# Patient Record
Sex: Male | Born: 1965 | Race: White | Hispanic: No | Smoking: Never smoker
Health system: Southern US, Community
[De-identification: ages and names within clinical notes are randomized; demographics above are authoritative.]

## PROBLEM LIST (undated history)

## (undated) DIAGNOSIS — E785 Hyperlipidemia, unspecified: Secondary | ICD-10-CM

## (undated) DIAGNOSIS — E119 Type 2 diabetes mellitus without complications: Secondary | ICD-10-CM

## (undated) DIAGNOSIS — Z8489 Family history of other specified conditions: Secondary | ICD-10-CM

## (undated) DIAGNOSIS — I10 Essential (primary) hypertension: Secondary | ICD-10-CM

## (undated) DIAGNOSIS — K5792 Diverticulitis of intestine, part unspecified, without perforation or abscess without bleeding: Secondary | ICD-10-CM

## (undated) HISTORY — PX: OTHER SURGICAL HISTORY: SHX169

## (undated) HISTORY — DX: Hyperlipidemia, unspecified: E78.5

## (undated) HISTORY — PX: JOINT REPLACEMENT: SHX530

---

## 2004-12-14 ENCOUNTER — Ambulatory Visit: Payer: Self-pay | Admitting: Specialist

## 2010-07-02 ENCOUNTER — Ambulatory Visit: Payer: Self-pay | Admitting: Internal Medicine

## 2012-01-29 HISTORY — PX: COLONOSCOPY: SHX174

## 2013-05-02 ENCOUNTER — Inpatient Hospital Stay (HOSPITAL_COMMUNITY)
Admission: EM | Admit: 2013-05-02 | Discharge: 2013-05-07 | DRG: 392 | Disposition: A | Discharge status: Home / Self Care | Payer: 59 | Source: Other Acute Inpatient Hospital | Attending: General Surgery | Admitting: General Surgery

## 2013-05-02 ENCOUNTER — Emergency Department: Payer: Self-pay | Admitting: Emergency Medicine

## 2013-05-02 DIAGNOSIS — K5732 Diverticulitis of large intestine without perforation or abscess without bleeding: Principal | ICD-10-CM | POA: Diagnosis present

## 2013-05-02 HISTORY — DX: Type 2 diabetes mellitus without complications: E11.9

## 2013-05-02 LAB — CBC WITH DIFFERENTIAL/PLATELET
BASOS PCT: 0.4 %
Basophil #: 0 10*3/uL (ref 0.0–0.1)
Eosinophil #: 0.1 10*3/uL (ref 0.0–0.7)
Eosinophil %: 0.5 %
HCT: 46 % (ref 40.0–52.0)
HGB: 15.9 g/dL (ref 13.0–18.0)
LYMPHS ABS: 1.5 10*3/uL (ref 1.0–3.6)
Lymphocyte %: 13.8 %
MCH: 30 pg (ref 26.0–34.0)
MCHC: 34.5 g/dL (ref 32.0–36.0)
MCV: 87 fL (ref 80–100)
MONOS PCT: 6.5 %
Monocyte #: 0.7 x10 3/mm (ref 0.2–1.0)
Neutrophil #: 8.8 10*3/uL — ABNORMAL HIGH (ref 1.4–6.5)
Neutrophil %: 78.8 %
PLATELETS: 177 10*3/uL (ref 150–440)
RBC: 5.3 10*6/uL (ref 4.40–5.90)
RDW: 12.9 % (ref 11.5–14.5)
WBC: 11.2 10*3/uL — AB (ref 3.8–10.6)

## 2013-05-02 LAB — URINALYSIS, COMPLETE
Bacteria: NONE SEEN
Bilirubin,UR: NEGATIVE
Blood: NEGATIVE
Glucose,UR: NEGATIVE mg/dL (ref 0–75)
KETONE: NEGATIVE
Leukocyte Esterase: NEGATIVE
NITRITE: NEGATIVE
Ph: 6 (ref 4.5–8.0)
Protein: NEGATIVE
RBC,UR: NONE SEEN /HPF (ref 0–5)
SPECIFIC GRAVITY: 1.004 (ref 1.003–1.030)
Squamous Epithelial: NONE SEEN
WBC UR: <1 /HPF (ref 0–5)

## 2013-05-02 LAB — COMPREHENSIVE METABOLIC PANEL
ALBUMIN: 3.8 g/dL (ref 3.4–5.0)
Alkaline Phosphatase: 49 U/L
Anion Gap: 7 (ref 7–16)
BUN: 11 mg/dL (ref 7–18)
Bilirubin,Total: 0.6 mg/dL (ref 0.2–1.0)
CREATININE: 1.01 mg/dL (ref 0.60–1.30)
Calcium, Total: 8.7 mg/dL (ref 8.5–10.1)
Chloride: 99 mmol/L (ref 98–107)
Co2: 28 mmol/L (ref 21–32)
EGFR (African American): >60
EGFR (Non-African Amer.): >60
Glucose: 207 mg/dL — ABNORMAL HIGH (ref 65–99)
Osmolality: 274 (ref 275–301)
POTASSIUM: 4 mmol/L (ref 3.5–5.1)
SGOT(AST): 31 U/L (ref 15–37)
SGPT (ALT): 72 U/L (ref 12–78)
SODIUM: 134 mmol/L — AB (ref 136–145)
Total Protein: 6.9 g/dL (ref 6.4–8.2)

## 2013-05-02 LAB — LIPASE, BLOOD: LIPASE: 171 U/L (ref 73–393)

## 2013-05-02 LAB — TROPONIN I: Troponin-I: <0.02 ng/mL

## 2013-05-03 ENCOUNTER — Encounter (HOSPITAL_COMMUNITY): Payer: Self-pay | Admitting: *Deleted

## 2013-05-03 DIAGNOSIS — K5732 Diverticulitis of large intestine without perforation or abscess without bleeding: Secondary | ICD-10-CM | POA: Diagnosis present

## 2013-05-03 LAB — CBC
HEMATOCRIT: 43.4 % (ref 39.0–52.0)
HEMOGLOBIN: 15.7 g/dL (ref 13.0–17.0)
MCH: 31.2 pg (ref 26.0–34.0)
MCHC: 36.2 g/dL — ABNORMAL HIGH (ref 30.0–36.0)
MCV: 86.1 fL (ref 78.0–100.0)
Platelets: 174 10*3/uL (ref 150–400)
RBC: 5.04 MIL/uL (ref 4.22–5.81)
RDW: 12.4 % (ref 11.5–15.5)
WBC: 9.2 10*3/uL (ref 4.0–10.5)

## 2013-05-03 LAB — GLUCOSE, CAPILLARY
GLUCOSE-CAPILLARY: 159 mg/dL — AB (ref 70–99)
GLUCOSE-CAPILLARY: 144 mg/dL — AB (ref 70–99)
GLUCOSE-CAPILLARY: 126 mg/dL — AB (ref 70–99)
Glucose-Capillary: 148 mg/dL — ABNORMAL HIGH (ref 70–99)
Glucose-Capillary: 148 mg/dL — ABNORMAL HIGH (ref 70–99)

## 2013-05-03 LAB — BASIC METABOLIC PANEL
BUN: 9 mg/dL (ref 6–23)
CHLORIDE: 100 meq/L (ref 96–112)
CO2: 25 mEq/L (ref 19–32)
Calcium: 8.8 mg/dL (ref 8.4–10.5)
Creatinine, Ser: 0.93 mg/dL (ref 0.50–1.35)
GFR calc Af Amer: >90 mL/min (ref >90)
GFR calc non Af Amer: >90 mL/min (ref >90)
Glucose, Bld: 192 mg/dL — ABNORMAL HIGH (ref 70–99)
POTASSIUM: 3.9 meq/L (ref 3.7–5.3)
Sodium: 140 mEq/L (ref 137–147)

## 2013-05-03 MED ORDER — KCL IN DEXTROSE-NACL 20-5-0.45 MEQ/L-%-% IV SOLN
INTRAVENOUS | Status: DC
  Administered 2013-05-03 – 2013-05-05 (×6): via INTRAVENOUS
  Filled 2013-05-03 (×10): qty 1000

## 2013-05-03 MED ORDER — ONDANSETRON HCL 4 MG/2ML IJ SOLN
4.0000 mg | Freq: Four times a day (QID) | INTRAMUSCULAR | Status: DC | PRN
  Administered 2013-05-04 – 2013-05-05 (×3): 4 mg via INTRAVENOUS
  Filled 2013-05-03 (×3): qty 2

## 2013-05-03 MED ORDER — CHLORHEXIDINE GLUCONATE 0.12 % MT SOLN
15.0000 mL | Freq: Two times a day (BID) | OROMUCOSAL | Status: DC
  Administered 2013-05-03 – 2013-05-06 (×5): 15 mL via OROMUCOSAL
  Filled 2013-05-03 (×6): qty 15

## 2013-05-03 MED ORDER — SODIUM CHLORIDE 0.9 % IV SOLN
1.0000 g | INTRAVENOUS | Status: DC
  Administered 2013-05-03 – 2013-05-06 (×4): 1 g via INTRAVENOUS
  Filled 2013-05-03 (×4): qty 1

## 2013-05-03 MED ORDER — HYDROMORPHONE HCL PF 1 MG/ML IJ SOLN
1.0000 mg | INTRAMUSCULAR | Status: DC | PRN
  Administered 2013-05-03 – 2013-05-04 (×9): 1 mg via INTRAVENOUS
  Filled 2013-05-03 (×12): qty 1

## 2013-05-03 MED ORDER — DIPHENHYDRAMINE HCL 50 MG/ML IJ SOLN
12.5000 mg | Freq: Three times a day (TID) | INTRAMUSCULAR | Status: DC | PRN
  Administered 2013-05-03 (×2): 25 mg via INTRAVENOUS
  Filled 2013-05-03 (×2): qty 1

## 2013-05-03 MED ORDER — INSULIN ASPART 100 UNIT/ML ~~LOC~~ SOLN
0.0000 [IU] | Freq: Every day | SUBCUTANEOUS | Status: DC
Start: 2013-05-03 — End: 2013-05-07

## 2013-05-03 MED ORDER — INSULIN ASPART 100 UNIT/ML ~~LOC~~ SOLN
0.0000 [IU] | Freq: Three times a day (TID) | SUBCUTANEOUS | Status: DC
  Administered 2013-05-03 (×2): 1 [IU] via SUBCUTANEOUS
  Administered 2013-05-03 – 2013-05-04 (×2): 2 [IU] via SUBCUTANEOUS
  Administered 2013-05-04: 1 [IU] via SUBCUTANEOUS
  Administered 2013-05-05: 2 [IU] via SUBCUTANEOUS
  Administered 2013-05-05 – 2013-05-06 (×3): 1 [IU] via SUBCUTANEOUS
  Administered 2013-05-06: 2 [IU] via SUBCUTANEOUS
  Administered 2013-05-07: 1 [IU] via SUBCUTANEOUS

## 2013-05-03 MED ORDER — ENOXAPARIN SODIUM 40 MG/0.4ML ~~LOC~~ SOLN
40.0000 mg | SUBCUTANEOUS | Status: DC
  Administered 2013-05-03 – 2013-05-06 (×4): 40 mg via SUBCUTANEOUS
  Filled 2013-05-03 (×5): qty 0.4

## 2013-05-03 MED ORDER — PANTOPRAZOLE SODIUM 40 MG IV SOLR
40.0000 mg | Freq: Every day | INTRAVENOUS | Status: DC
  Administered 2013-05-03 – 2013-05-06 (×5): 40 mg via INTRAVENOUS
  Filled 2013-05-03 (×9): qty 40

## 2013-05-03 MED ORDER — BIOTENE DRY MOUTH MT LIQD
15.0000 mL | Freq: Two times a day (BID) | OROMUCOSAL | Status: DC
  Administered 2013-05-03 – 2013-05-06 (×4): 15 mL via OROMUCOSAL

## 2013-05-03 NOTE — H&P (Signed)
Francisco Porter is an 48 y.o. male.   Chief Complaint: Left lower quadrant abdominal pain HPI: He presented to [facility] emergency department earlier tonight with left lower quadrant abdominal pain. He has a previous history of diverticulitis. This pain was similar but more severe. Workup demonstrated mild leukocytosis 11,200 and CT scan of the abdomen and pelvis was obtained. This demonstrated sigmoid diverticulitis with microperforation. No abscess was seen. The patient requested transfer to [facility] hospital and I accepted.  Past medical history: Diverticulitis, colon polyps No past surgical history on file.  No family history on file. Social History: Does not smoke, occasionally drinks alcohol  Allergies: Codeine  No prescriptions prior to admission    No results found for this or any previous visit (from the past 48 hour(s)). No results found.  Review of Systems  Constitutional: Negative for fever and chills.  HENT: Negative.   Eyes: Negative.   Respiratory: Negative.   Cardiovascular: Negative for chest pain and leg swelling.  Gastrointestinal: Positive for abdominal pain. Negative for nausea, vomiting and diarrhea.  Genitourinary: Negative.   Musculoskeletal: Negative.   Skin: Negative.   Neurological: Negative.   Endo/Heme/Allergies: Negative.   Psychiatric/Behavioral: Negative.     Blood pressure 135/66, pulse 92, temperature 98.3 F (36.8 C), temperature source Oral, resp. rate 20, height 5\' 8"  (1.727 m), weight 199 lb 15.3 oz (90.7 kg), SpO2 100.00%. Physical Exam  Constitutional: He is oriented to person, place, and time. He appears well-developed and well-nourished. No distress.  HENT:  Head: Normocephalic and atraumatic.  Mouth/Throat: Oropharynx is clear and moist. No oropharyngeal exudate.  Eyes: EOM are normal. Pupils are equal, round, and reactive to light. Right eye exhibits no discharge. Left eye exhibits no discharge.  Neck: Normal range of motion.  Neck supple. No tracheal deviation present.  Cardiovascular: Normal rate and intact distal pulses.   Murmur heard. 2/6 systolic murmur  Respiratory: Effort normal and breath sounds normal. No stridor. No respiratory distress. He has no wheezes. He has no rales.  GI: Soft. He exhibits no distension. There is tenderness. There is guarding. There is no rebound.  Tender left lower quadrant without peritoneal signs, occasional voluntary guarding  Musculoskeletal: Normal range of motion. He exhibits no edema and no tenderness.  Neurological: He is alert and oriented to person, place, and time. He exhibits normal muscle tone. Coordination normal.  Skin: Skin is warm.  Psychiatric: He has a normal mood and affect.     Assessment/Plan Acute sigmoid diverticulitis with microperforation - plan IV antibiotics and bowel rest. If he does not improve, may need urgent colectomy with likely colostomy. This plan was discussed in detail with him and I answered his questions.  Primus Gritton E 05/03/2013, 12:05 AM

## 2013-05-03 NOTE — Progress Notes (Signed)
UR completed.  Eddye Broxterman, RN BSN M[facility] CCM Trauma/Neuro ICU Case Manager [phone]  

## 2013-05-03 NOTE — Progress Notes (Signed)
  Subjective: Pt doing well today.  Less abd pain.  No BM/diarrhea.  Passing flatus  Objective: Vital signs in last 24 hours: Temp:  [98.3 F (36.8 C)-98.6 F (37 C)] 98.3 F (36.8 C) (04/06 0618) Pulse Rate:  [62-103] 103 (04/06 0618) Resp:  [17-20] 17 (04/06 0618) BP: (106-135)/(55-69) 106/69 mmHg (04/06 0618) SpO2:  [98 %-100 %] 99 % (04/06 0618) Weight:  [199 lb 15.3 oz (90.7 kg)] 199 lb 15.3 oz (90.7 kg) (04/05 2320)    Intake/Output from previous day: 04/05 0701 - 04/06 0700 In: 614.6 [I.V.:614.6] Out: 400 [Urine:400] Intake/Output this shift:    General appearance: alert and cooperative Resp: clear to auscultation bilaterally Cardio: RRR GI: soft, TTP LLQ, min TTP RLQ, no rebound/guarding active BS  Lab Results:   Recent Labs  05/03/13 0608  WBC 9.2  HGB 15.7  HCT 43.4  PLT 174   BMET  Recent Labs  05/03/13 0608  NA 140  K 3.9  CL 100  CO2 25  GLUCOSE 192*  BUN 9  CREATININE 0.93  CALCIUM 8.8   PT/INR No results found for this basename: LABPROT, INR,  in the last 72 hours ABG No results found for this basename: PHART, PCO2, PO2, HCO3,  in the last 72 hours  Studies/Results: No results found.  Anti-infectives: Anti-infectives   Start     Dose/Rate Route Frequency Ordered Stop   05/03/13 0030  ertapenem (INVANZ) 1 g in sodium chloride 0.9 % 50 mL IVPB     1 g 100 mL/hr over 30 Minutes Intravenous Every 24 hours 05/03/13 0021        Assessment/Plan: 48 y/o M with diverticulitis and microperforation.  Abd pain appears to be improving with abx at this time. 1. Con't abx 2. Con't NPO 3. Encourage Ambulation   Rosario Jacks., Anne Hahn 05/03/2013

## 2013-05-04 DIAGNOSIS — R51 Headache: Secondary | ICD-10-CM

## 2013-05-04 LAB — BASIC METABOLIC PANEL
BUN: 7 mg/dL (ref 6–23)
CO2: 27 meq/L (ref 19–32)
Calcium: 8.5 mg/dL (ref 8.4–10.5)
Chloride: 99 mEq/L (ref 96–112)
Creatinine, Ser: 0.99 mg/dL (ref 0.50–1.35)
GFR calc Af Amer: >90 mL/min (ref >90)
GFR calc non Af Amer: >90 mL/min (ref >90)
GLUCOSE: 171 mg/dL — AB (ref 70–99)
POTASSIUM: 4.3 meq/L (ref 3.7–5.3)
SODIUM: 138 meq/L (ref 137–147)

## 2013-05-04 LAB — CBC
HCT: 43.7 % (ref 39.0–52.0)
Hemoglobin: 15.6 g/dL (ref 13.0–17.0)
MCH: 31.4 pg (ref 26.0–34.0)
MCHC: 35.7 g/dL (ref 30.0–36.0)
MCV: 87.9 fL (ref 78.0–100.0)
PLATELETS: 157 10*3/uL (ref 150–400)
RBC: 4.97 MIL/uL (ref 4.22–5.81)
RDW: 12.3 % (ref 11.5–15.5)
WBC: 8.6 10*3/uL (ref 4.0–10.5)

## 2013-05-04 LAB — GLUCOSE, CAPILLARY
GLUCOSE-CAPILLARY: 174 mg/dL — AB (ref 70–99)
GLUCOSE-CAPILLARY: 147 mg/dL — AB (ref 70–99)
GLUCOSE-CAPILLARY: 146 mg/dL — AB (ref 70–99)
Glucose-Capillary: 116 mg/dL — ABNORMAL HIGH (ref 70–99)

## 2013-05-04 MED ORDER — ACETAMINOPHEN 500 MG PO TABS
1000.0000 mg | ORAL_TABLET | Freq: Four times a day (QID) | ORAL | Status: DC | PRN
  Administered 2013-05-04 – 2013-05-05 (×2): 1000 mg via ORAL
  Filled 2013-05-04 (×2): qty 2

## 2013-05-04 MED ORDER — LORAZEPAM 2 MG/ML IJ SOLN
0.5000 mg | Freq: Four times a day (QID) | INTRAMUSCULAR | Status: DC | PRN
  Administered 2013-05-05: 0.5 mg via INTRAVENOUS
  Filled 2013-05-04 (×2): qty 1

## 2013-05-04 MED ORDER — MORPHINE SULFATE 2 MG/ML IJ SOLN
1.0000 mg | INTRAMUSCULAR | Status: DC | PRN
  Administered 2013-05-04 – 2013-05-05 (×6): 2 mg via INTRAVENOUS
  Filled 2013-05-04 (×6): qty 1

## 2013-05-04 MED ORDER — IBUPROFEN 400 MG PO TABS
400.0000 mg | ORAL_TABLET | Freq: Three times a day (TID) | ORAL | Status: DC | PRN
  Administered 2013-05-04 – 2013-05-06 (×3): 600 mg via ORAL
  Filled 2013-05-04 (×3): qty 2

## 2013-05-04 NOTE — Progress Notes (Signed)
Patient ID: Francisco Porter, male   DOB: [birth year], 48 y.o.   MRN: [id number]    Subjective: Pt feels better than yesterday, but still having pain.  Passing flatus and stool.  Having a horrible headache.  Objective: Vital signs in last 24 hours: Temp:  [97.5 F (36.4 C)-99.2 F (37.3 C)] 99.2 F (37.3 C) (04/07 0542) Pulse Rate:  [95-100] 100 (04/07 0542) Resp:  [16-17] 16 (04/07 0542) BP: (118-125)/(79-84) 125/79 mmHg (04/07 0542) SpO2:  [96 %] 96 % (04/07 0542) Last BM Date: 05/04/13  Intake/Output from previous day: 04/06 0701 - 04/07 0700 In: 2675 [I.V.:2625; IV Piggyback:50] Out: 3299 [Urine:1650] Intake/Output this shift: Total I/O In: 0  Out: 325 [Urine:325]  PE: Abd: soft, still very tender in LLQ, mild RLQ tenderness, +BS, ND Heart: regular Lungs: CTAB  Lab Results:   Recent Labs  05/03/13 0608 05/04/13 0611  WBC 9.2 8.6  HGB 15.7 15.6  HCT 43.4 43.7  PLT 174 157   BMET  Recent Labs  05/03/13 0608 05/04/13 0611  NA 140 138  K 3.9 4.3  CL 100 99  CO2 25 27  GLUCOSE 192* 171*  BUN 9 7  CREATININE 0.93 0.99  CALCIUM 8.8 8.5   PT/INR No results found for this basename: LABPROT, INR,  in the last 72 hours CMP     Component Value Date/Time   NA 138 05/04/2013 0611   K 4.3 05/04/2013 0611   CL 99 05/04/2013 0611   CO2 27 05/04/2013 0611   GLUCOSE 171* 05/04/2013 0611   BUN 7 05/04/2013 0611   CREATININE 0.99 05/04/2013 0611   CALCIUM 8.5 05/04/2013 0611   GFRNONAA >90 05/04/2013 0611   GFRAA >90 05/04/2013 0611   Lipase  No results found for this basename: lipase       Studies/Results: No results found.  Anti-infectives: Anti-infectives   Start     Dose/Rate Route Frequency Ordered Stop   05/03/13 0030  ertapenem (INVANZ) 1 g in sodium chloride 0.9 % 50 mL IVPB     1 g 100 mL/hr over 30 Minutes Intravenous Every 24 hours 05/03/13 0021         Assessment/Plan  1. Diverticulitis with microperforation  Plan: 1. Will add tylenol, ibuprofen  for HA.  Will also change dilaudid to morphine to see if this helps 2. Patient is still pretty tender.  Leave NPO x ice chips 3. Cont Invanz D2    LOS: 2 days    Bellina Tokarczyk E 05/04/2013, 11:11 AM Pager: [phone]

## 2013-05-04 NOTE — Progress Notes (Signed)
LLQ pain is not that bad when the patient is distracted.  Has had several bowel movements and has excellent bowel sounds.  Probably can start clear liquids tomorrow.  Complaining of headaches and nausea.  Kathryne Eriksson. Dahlia Bailiff, MD, [place] [phone] [facility]

## 2013-05-05 LAB — CBC
HCT: 42.9 % (ref 39.0–52.0)
Hemoglobin: 14.7 g/dL (ref 13.0–17.0)
MCH: 31.7 pg (ref 26.0–34.0)
MCHC: 34.3 g/dL (ref 30.0–36.0)
MCV: 92.5 fL (ref 78.0–100.0)
Platelets: 130 10*3/uL — ABNORMAL LOW (ref 150–400)
RBC: 4.64 MIL/uL (ref 4.22–5.81)
RDW: 12.6 % (ref 11.5–15.5)
WBC: 4.6 10*3/uL (ref 4.0–10.5)

## 2013-05-05 LAB — GLUCOSE, CAPILLARY
Glucose-Capillary: 133 mg/dL — ABNORMAL HIGH (ref 70–99)
Glucose-Capillary: 163 mg/dL — ABNORMAL HIGH (ref 70–99)
Glucose-Capillary: 142 mg/dL — ABNORMAL HIGH (ref 70–99)
Glucose-Capillary: 147 mg/dL — ABNORMAL HIGH (ref 70–99)

## 2013-05-05 NOTE — Progress Notes (Signed)
Doing much better.  Probably can advance to fulls tonight, soft in AM, possibly home tomorrow if he continues to do well.  Kathryne Eriksson. Dahlia Bailiff, MD, [place] [phone] [facility] Surgery

## 2013-05-05 NOTE — Progress Notes (Signed)
Patient ID: Francisco Porter, male   DOB: May 24, 1965, 48 y.o.   MRN: [id number]    Subjective: Pt feels much better today.  Less pain.  Objective: Vital signs in last 24 hours: Temp:  [97.5 F (36.4 C)-97.9 F (36.6 C)] 97.5 F (36.4 C) (04/08 0545) Pulse Rate:  [78-81] 79 (04/08 0545) Resp:  [16-17] 17 (04/08 0545) BP: (105-122)/(75-77) 122/75 mmHg (04/08 0545) SpO2:  [97 %-99 %] 97 % (04/08 0545) Last BM Date: 05/04/13  Intake/Output from previous day: 04/07 0701 - 04/08 0700 In: 810 [I.V.:810] Out: 825 [Urine:825] Intake/Output this shift: Total I/O In: 1113 [I.V.:1113] Out: -   PE: Abd: soft, much less tender in LLQ, NT elsewhere, +BS, ND  Lab Results:   Recent Labs  05/04/13 0611 05/05/13 0625  WBC 8.6 4.6  HGB 15.6 14.7  HCT 43.7 42.9  PLT 157 130*   BMET  Recent Labs  05/03/13 0608 05/04/13 0611  NA 140 138  K 3.9 4.3  CL 100 99  CO2 25 27  GLUCOSE 192* 171*  BUN 9 7  CREATININE 0.93 0.99  CALCIUM 8.8 8.5   PT/INR No results found for this basename: LABPROT, INR,  in the last 72 hours CMP     Component Value Date/Time   NA 138 05/04/2013 0611   K 4.3 05/04/2013 0611   CL 99 05/04/2013 0611   CO2 27 05/04/2013 0611   GLUCOSE 171* 05/04/2013 0611   BUN 7 05/04/2013 0611   CREATININE 0.99 05/04/2013 0611   CALCIUM 8.5 05/04/2013 0611   GFRNONAA >90 05/04/2013 0611   GFRAA >90 05/04/2013 0611   Lipase  No results found for this basename: lipase       Studies/Results: No results found.  Anti-infectives: Anti-infectives   Start     Dose/Rate Route Frequency Ordered Stop   05/03/13 0030  ertapenem (INVANZ) 1 g in sodium chloride 0.9 % 50 mL IVPB     1 g 100 mL/hr over 30 Minutes Intravenous Every 24 hours 05/03/13 0021         Assessment/Plan  1. Diverticulitis with  Microperforation  Plan: 1. Will allow clear liquids today 2. Invanz D3/7   LOS: 3 days    Henreitta Cea 05/05/2013, 9:47 AM Pager: [phone]

## 2013-05-06 LAB — GLUCOSE, CAPILLARY
GLUCOSE-CAPILLARY: 156 mg/dL — AB (ref 70–99)
Glucose-Capillary: 100 mg/dL — ABNORMAL HIGH (ref 70–99)
Glucose-Capillary: 126 mg/dL — ABNORMAL HIGH (ref 70–99)
Glucose-Capillary: 160 mg/dL — ABNORMAL HIGH (ref 70–99)

## 2013-05-06 MED ORDER — CIPROFLOXACIN HCL 500 MG PO TABS
500.0000 mg | ORAL_TABLET | Freq: Two times a day (BID) | ORAL | Status: DC
Start: 2013-05-06 — End: 2013-05-07
  Administered 2013-05-06 – 2013-05-07 (×2): 500 mg via ORAL
  Filled 2013-05-06 (×4): qty 1

## 2013-05-06 MED ORDER — IBUPROFEN 400 MG PO TABS: 400.0000 mg | ORAL_TABLET | Freq: Three times a day (TID) | ORAL | Status: DC | PRN

## 2013-05-06 MED ORDER — CIPROFLOXACIN HCL 500 MG PO TABS: 500.0000 mg | ORAL_TABLET | Freq: Two times a day (BID) | ORAL | Status: DC

## 2013-05-06 MED ORDER — METRONIDAZOLE 500 MG PO TABS
500.0000 mg | ORAL_TABLET | Freq: Four times a day (QID) | ORAL | Status: DC
  Administered 2013-05-06 – 2013-05-07 (×3): 500 mg via ORAL
  Filled 2013-05-06 (×7): qty 1

## 2013-05-06 MED ORDER — METRONIDAZOLE 500 MG PO TABS: 500.0000 mg | ORAL_TABLET | Freq: Four times a day (QID) | ORAL | Status: DC

## 2013-05-06 MED ORDER — HYDROCODONE-ACETAMINOPHEN 5-325 MG PO TABS: 1.0000 | ORAL_TABLET | ORAL | Status: DC | PRN

## 2013-05-06 NOTE — Progress Notes (Signed)
Patient seen and examined.  Still having mild LLQ pain.  This is his third episode of diverticulitis.

## 2013-05-06 NOTE — Progress Notes (Signed)
  Subjective: Feels much better today.  Tolerated full liquids.   Objective: Vital signs in last 24 hours: Temp:  [98.2 F (36.8 C)-98.6 F (37 C)] 98.6 F (37 C) (04/09 0533) Pulse Rate:  [85-86] 85 (04/09 0533) Resp:  [14-18] 14 (04/09 0533) BP: (122-135)/(76-81) 128/76 mmHg (04/09 0533) SpO2:  [96 %-98 %] 96 % (04/09 0533) Last BM Date: 05/05/13  Intake/Output from previous day: 04/08 0701 - 04/09 0700 In: 2574.3 [P.O.:480; I.V.:2094.3] Out: -  Intake/Output this shift:   PE General appearance: alert, cooperative and no distress GI: +bs abdomen is soft, minimal ttp to llq with deep palpation.    Lab Results:   Recent Labs  05/04/13 0611 05/05/13 0625  WBC 8.6 4.6  HGB 15.6 14.7  HCT 43.7 42.9  PLT 157 130*   BMET  Recent Labs  05/04/13 0611  NA 138  K 4.3  CL 99  CO2 27  GLUCOSE 171*  BUN 7  CREATININE 0.99  CALCIUM 8.5   PT/INR No results found for this basename: LABPROT, INR,  in the last 72 hours ABG No results found for this basename: PHART, PCO2, PO2, HCO3,  in the last 72 hours  Studies/Results: No results found.  Anti-infectives: Anti-infectives   Start     Dose/Rate Route Frequency Ordered Stop   05/03/13 0030  ertapenem (INVANZ) 1 g in sodium chloride 0.9 % 50 mL IVPB     1 g 100 mL/hr over 30 Minutes Intravenous Every 24 hours 05/03/13 0021        Assessment/Plan: Diverticulitis with microperforation Advance to soft diet, if able to tolerate discharge home today.  Change to cipro/flagyl PO   LOS: 4 days    Amiya Escamilla ANP-BC 05/06/2013 11:22 AM

## 2013-05-07 LAB — GLUCOSE, CAPILLARY: Glucose-Capillary: 130 mg/dL — ABNORMAL HIGH (ref 70–99)

## 2013-05-07 LAB — CULTURE, BLOOD (SINGLE)

## 2013-05-07 NOTE — Discharge Instructions (Signed)
Low-Fiber Diet °Fiber is found in fruits, vegetables, and grains. A low-fiber diet restricts fibrous foods that are not digested in the small intestine. A diet containing about 10 grams of fiber is considered low fiber.  °PURPOSE °· To prevent blockage of a partially obstructed or narrowed gastrointestinal tract. °· To reduce fecal weight and volume. °· To slow the movement of feces. °WHEN IS THIS DIET USED? °· It may be used during the acute phase of Crohn disease, ulcerative colitis, regional enteritis, or diverticulitis. °· It may be used if your intestinal or esophageal tubes are narrowing (stenosis). °· It may be used as a transitional diet following surgery, injury (trauma), or illness. °CHOOSING FOODS °Check labels, especially on foods from the starch list. Often times, dietary fiber content is listed on the nutrition facts panel. Please ask your Registered Dietitian if you have questions about specific foods that are related to your condition, especially if the food is not listed on this handout. °Breads and Starches °· Allowed: White, French, and pita breads, plain rolls, buns, or sweet rolls, doughnuts, waffles, pancakes, bagels. Plain muffins, biscuits, matzoth. Soda, saltine, graham crackers. Pretzels, rusks, melba toast, zwieback. Cooked cereals: cornmeal, farina, or cream cereals. Dry cereals: refined corn, wheat, rice, and oat cereals (check label). Potatoes prepared any way without skins, refined macaroni, spaghetti, noodles, refined rice. °· Avoid: Whole-wheat bread, rolls, and crackers. Multigrains, rye, bran seeds, nuts, or coconut. Cereals containing whole grains, multigrains, bran, coconut, nuts, raisins. Cooked or dry oatmeal. Coarse wheat cereals, granola. Cereals advertised as "high fiber." Potato skins. Whole-grain pasta, wild or brown rice. Popcorn. °Vegetables °· Allowed: Strained tomato and vegetable juices. Fresh lettuce, cucumber, spinach. Well-cooked or canned: asparagus, bean sprouts,  broccoli, cut green beans, cauliflower, pumpkin, beets, mushrooms, yellow squash, tomato, tomato sauce, zucchini, turnips. Keep servings limited to ½ cup. °· Avoid: Fresh, cooked, or canned: artichokes, baked beans, beet greens, Brussels sprouts, corn, kale, legumes, peas, sweet potatoes. Avoid large servings of any vegetables. °Fruit °· Allowed: All fruit juices except prune juice. Cooked or canned fruits without skin and seeds: apricots, applesauce, cantaloupe, cherries, grapefruit, grapes, kiwi, mandarin oranges, peaches, pears, fruit cocktail, pineapple, plums, watermelon. Fresh without skin: banana, grapes, cantaloupe, avocado, cherries, pineapple, kiwi, nectarines, peaches, blueberries. Keep servings limited to ½ cup or 1 piece. °· Avoid: Fresh: apples with or without skin, apricots, mangoes, pears, raspberries, strawberries. Prune juice and juices with pulp, stewed or dried prunes. Dried fruits, raisins, dates. Avoid large servings of all fresh fruits. °Meat and Protein Substitutes °· Allowed: Ground or well-cooked tender beef, ham, veal, lamb, pork, poultry. Eggs, plain cheese. Fish, oysters, shrimp, lobster, other seafood. Liver, organ meats. Smooth nut butters. °· Avoid: Tough, fibrous meats with gristle. Chunky nut butter. Cheese with seeds, nuts, or other foods not allowed. Nuts, seeds, legumes, dried peas, beans, lentils. °Dairy °· Allowed: All milk products except those not allowed. °· Avoid: Yogurt or cheese that contains nuts, seeds, or added fruit.  °Soups and Combination Foods °· Allowed: Bouillon, broth, or cream soups made from allowed foods. Any strained soup. Casseroles or mixed dishes made with allowed foods. °· Avoid: Soups made from vegetables that are not allowed or that contain other foods not allowed. °Desserts and Sweets °· Allowed: Plain cakes and cookies, pie made with allowed fruit, pudding, custard, cream pie. Gelatin, fruit, ice, sherbet, frozen ice pops. Ice cream, ice milk without  nuts. Plain hard candy, honey, jelly, molasses, syrup, sugar, chocolate syrup, gumdrops, marshmallows. °· Avoid: Desserts, cookies, or candies that contain   nuts, peanut butter, dried fruits. Jams, preserves with seeds, marmalade. Fats and Oils  Allowed:Margarine, butter, cream, mayonnaise, salad oils, plain salad dressings made from allowed foods.  Avoid: Seeds, nuts, olives. Beverages  Allowed: All, except those listed to avoid.  Avoid: Fruit juices with high pulp, prune juice. Condiments  Allowed:Ketchup, mustard, horseradish, vinegar, cream sauce, cheese sauce, cocoa powder. Spices in moderation: allspice, basil, bay leaves, celery powder or leaves, cinnamon, cumin powder, curry powder, ginger, mace, marjoram, onion or garlic powder, oregano, paprika, parsley flakes, ground pepper, rosemary, sage, savory, tarragon, thyme, turmeric.  Avoid: Coconut, pickles. SAMPLE MENU Breakfast   cup orange juice.  1 boiled egg.  1 slice white toast.  Margarine.   cup cornflakes.  1 cup milk.  Beverage. Lunch   cup chicken noodle soup.  2 to 3 oz sliced roast beef.  2 slices white bread.  Mayonnaise.   cup tomato juice.  1 small banana.  Beverage. Dinner  3 oz baked chicken.   cup scalloped potatoes.   cup cooked beets.  White dinner roll.  Margarine.   cup canned peaches.  Beverage. Document Released: 07/06/2001 Document Revised: 09/16/2012 Document Reviewed: 01/31/2011 [facility] Patient Information 2014 [place].  High-Fiber Diet Fiber is found in fruits, vegetables, and grains. A high-fiber diet encourages the addition of more whole grains, legumes, fruits, and vegetables in your diet. The recommended amount of fiber for adult males is 38 g per day. For adult females, it is 25 g per day. Pregnant and lactating women should get 28 g of fiber per day. If you have a digestive or bowel problem, ask your caregiver for advice before adding high-fiber  foods to your diet. Eat a variety of high-fiber foods instead of only a select few type of foods.  PURPOSE  To increase stool bulk.  To make bowel movements more regular to prevent constipation.  To lower cholesterol.  To prevent overeating. WHEN IS THIS DIET USED?  It may be used if you have constipation and hemorrhoids.  It may be used if you have uncomplicated diverticulosis (intestine condition) and irritable bowel syndrome.  It may be used if you need help with weight management.  It may be used if you want to add it to your diet as a protective measure against atherosclerosis, diabetes, and cancer. SOURCES OF FIBER  Whole-grain breads and cereals.  Fruits, such as apples, oranges, bananas, berries, prunes, and pears.  Vegetables, such as green peas, carrots, sweet potatoes, beets, broccoli, cabbage, spinach, and artichokes.  Legumes, such split peas, soy, lentils.  Almonds. FIBER CONTENT IN FOODS Starches and Grains / Dietary Fiber (g)  Cheerios, 1 cup / 3 g  Corn Flakes cereal, 1 cup / 0.7 g  Rice crispy treat cereal, 1 cup / 0.3 g  Instant oatmeal (cooked),  cup / 2 g  Frosted wheat cereal, 1 cup / 5.1 g  Brown, long-grain rice (cooked), 1 cup / 3.5 g  White, long-grain rice (cooked), 1 cup / 0.6 g  Enriched macaroni (cooked), 1 cup / 2.5 g Legumes / Dietary Fiber (g)  Baked beans (canned, plain, or vegetarian),  cup / 5.2 g  Kidney beans (canned),  cup / 6.8 g  Pinto beans (cooked),  cup / 5.5 g Breads and Crackers / Dietary Fiber (g)  Plain or honey graham crackers, 2 squares / 0.7 g  Saltine crackers, 3 squares / 0.3 g  Plain, salted pretzels, 10 pieces / 1.8 g  Whole-wheat bread, 1 slice /  1.9 g  White bread, 1 slice / 0.7 g  Raisin bread, 1 slice / 1.2 g  Plain bagel, 3 oz / 2 g  Flour tortilla, 1 oz / 0.9 g  Corn tortilla, 1 small / 1.5 g  Hamburger or hotdog bun, 1 small / 0.9 g Fruits / Dietary Fiber (g)  Apple with  skin, 1 medium / 4.4 g  Sweetened applesauce,  cup / 1.5 g  Banana,  medium / 1.5 g  Grapes, 10 grapes / 0.4 g  Orange, 1 small / 2.3 g  Raisin, 1.5 oz / 1.6 g  Melon, 1 cup / 1.4 g Vegetables / Dietary Fiber (g)  Green beans (canned),  cup / 1.3 g  Carrots (cooked),  cup / 2.3 g  Broccoli (cooked),  cup / 2.8 g  Peas (cooked),  cup / 4.4 g  Mashed potatoes,  cup / 1.6 g  Lettuce, 1 cup / 0.5 g  Corn (canned),  cup / 1.6 g  Tomato,  cup / 1.1 g Document Released: 01/14/2005 Document Revised: 07/16/2011 Document Reviewed: 04/18/2011 [facility] Patient Information 2014 [place], [place].  Diverticulitis A diverticulum is a small pouch or sac on the colon. Diverticulosis is the presence of these diverticula on the colon. Diverticulitis is the irritation (inflammation) or infection of diverticula. CAUSES  The colon and its diverticula contain bacteria. If food particles block the tiny opening to a diverticulum, the bacteria inside can grow and cause an increase in pressure. This leads to infection and inflammation and is called diverticulitis. SYMPTOMS   Abdominal pain and tenderness. Usually, the pain is located on the left side of your abdomen. However, it could be located elsewhere.  Fever.  Bloating.  Feeling sick to your stomach (nausea).  Throwing up (vomiting).  Abnormal stools. DIAGNOSIS  Your caregiver will take a history and perform a physical exam. Since many things can cause abdominal pain, other tests may be necessary. Tests may include:  Blood tests.  Urine tests.  X-ray of the abdomen.  CT scan of the abdomen. Sometimes, surgery is needed to determine if diverticulitis or other conditions are causing your symptoms. TREATMENT  Most of the time, you can be treated without surgery. Treatment includes:  Resting the bowels by only having liquids for a few days. As you improve, you will need to eat a low-fiber diet.  Intravenous (IV) fluids  if you are losing body fluids (dehydrated).  Antibiotic medicines that treat infections may be given.  Pain and nausea medicine, if needed.  Surgery if the inflamed diverticulum has burst. HOME CARE INSTRUCTIONS   Try a clear liquid diet (broth, tea, or water for as long as directed by your caregiver). You may then gradually begin a low-fiber diet as tolerated.  A low-fiber diet is a diet with less than 10 grams of fiber. Choose the foods below to reduce fiber in the diet:  White breads, cereals, rice, and pasta.  Cooked fruits and vegetables or soft fresh fruits and vegetables without the skin.  Ground or well-cooked tender beef, ham, veal, lamb, pork, or poultry.  Eggs and seafood.  After your diverticulitis symptoms have improved, your caregiver may put you on a high-fiber diet. A high-fiber diet includes 14 grams of fiber for every 1000 calories consumed. For a standard 2000 calorie diet, you would need 28 grams of fiber. Follow these diet guidelines to help you increase the fiber in your diet. It is important to slowly increase the amount fiber in your  diet to avoid gas, constipation, and bloating.  Choose whole-grain breads, cereals, pasta, and brown rice.  Choose fresh fruits and vegetables with the skin on. Do not overcook vegetables because the more vegetables are cooked, the more fiber is lost.  Choose more nuts, seeds, legumes, dried peas, beans, and lentils.  Look for food products that have greater than 3 grams of fiber per serving on the Nutrition Facts label.  Take all medicine as directed by your caregiver.  If your caregiver has given you a follow-up appointment, it is very important that you go. Not going could result in lasting (chronic) or permanent injury, pain, and disability. If there is any problem keeping the appointment, call to reschedule. SEEK MEDICAL CARE IF:   Your pain does not improve.  You have a hard time advancing your diet beyond clear  liquids.  Your bowel movements do not return to normal. SEEK IMMEDIATE MEDICAL CARE IF:   Your pain becomes worse.  You have an oral temperature above 102 F (38.9 C), not controlled by medicine.  You have repeated vomiting.  You have bloody or black, tarry stools.  Symptoms that brought you to your caregiver become worse or are not getting better. MAKE SURE YOU:   Understand these instructions.  Will watch your condition.  Will get help right away if you are not doing well or get worse. Document Released: 10/24/2004 Document Revised: 04/08/2011 Document Reviewed: 02/19/2010 [facility] Patient Information 2014 [place].

## 2013-05-07 NOTE — Discharge Summary (Signed)
Patient ID: Francisco Porter MRN: [id number] DOB/AGE: 04-25-1965 48 y.o.  Admit date: 05/02/2013 Discharge date: 05/07/2013  Procedures: none  Consults: None  Reason for Admission: He presented to [facility] emergency department earlier tonight with left lower quadrant abdominal pain. He has a previous history of diverticulitis. This pain was similar but more severe. Workup demonstrated mild leukocytosis 11,200 and CT scan of the abdomen and pelvis was obtained. This demonstrated sigmoid diverticulitis with microperforation. No abscess was seen. The patient requested transfer to [facility] hospital and I accepted.  Admission Diagnoses:  1. Acute sigmoid diverticulitis  Hospital Course:  The patient was admitted and placed on IV Invanz.  It took him several days for his pain to begin to improve enough to try him on a liquid diet.  By HD 3, we tried clear liquids.  He tolerated this well and his diet was able to be advanced as tolerated to a low fiber diet.  He was transitioned to oral abx therapy as well.  He was stable on HD 5 for dc home.  PE: Abd: soft, essentially NT, ND, +BS  Discharge Diagnoses:  Active Problems:   Sigmoid diverticulitis   Discharge Medications:   Medication List         ciprofloxacin 500 MG tablet  Commonly known as:  CIPRO  Take 1 tablet (500 mg total) by mouth 2 (two) times daily.     ibuprofen 400 MG tablet  Commonly known as:  ADVIL,MOTRIN  Take 1-1.5 tablets (400-600 mg total) by mouth every 8 (eight) hours as needed for headache, mild pain or moderate pain.     metroNIDAZOLE 500 MG tablet  Commonly known as:  FLAGYL  Take 1 tablet (500 mg total) by mouth every 6 (six) hours.     psyllium 58.6 % packet  Commonly known as:  METAMUCIL  Take 1 packet by mouth at bedtime.        Discharge Instructions:     Follow-up Information   Follow up with [facility] E, MD. Schedule an appointment as soon as possible for a visit in 2 weeks.   Specialty:  General Surgery   Contact information:   [address] [place] [address] [phone]       Signed: Henreitta Cea 05/07/2013, 9:01 AM

## 2013-05-07 NOTE — Discharge Planning (Signed)
Patient discharged home in stable condition. Verbalizes understanding of all discharge instructions, including home medications and follow up appointments. 

## 2013-05-12 ENCOUNTER — Telehealth (INDEPENDENT_AMBULATORY_CARE_PROVIDER_SITE_OTHER): Payer: Self-pay

## 2013-05-12 NOTE — Telephone Encounter (Signed)
LMOM for pt to call back to confirm appt in system.

## 2013-05-19 ENCOUNTER — Encounter (INDEPENDENT_AMBULATORY_CARE_PROVIDER_SITE_OTHER): Payer: Self-pay | Admitting: General Surgery

## 2013-05-19 ENCOUNTER — Ambulatory Visit (INDEPENDENT_AMBULATORY_CARE_PROVIDER_SITE_OTHER): Payer: PRIVATE HEALTH INSURANCE | Admitting: General Surgery

## 2013-05-19 VITALS — BP 132/88 | HR 76 | Temp 97.4°F | Resp 14 | Ht 68.0 in | Wt 194.8 lb

## 2013-05-19 DIAGNOSIS — K5732 Diverticulitis of large intestine without perforation or abscess without bleeding: Secondary | ICD-10-CM

## 2013-05-19 NOTE — Progress Notes (Signed)
Subjective:     Patient ID: Francisco Porter, male   DOB: 1966/01/01, 48 y.o.   MRN: [id number]  HPI Patient presents for followup of sigmoid diverticulitis with microperforation. He was hospitalized and treated with antibiotics and bowel rest. He did well. He has been feeling much better since he's been home. He has no significant pain. Bowel movements are about 3-4 times per day. He is staying on a low fiber diet.  Review of Systems     Objective:   Physical Exam  Constitutional: He is oriented to person, place, and time. He appears well-developed and well-nourished.  HENT:  Head: Normocephalic.  Neck: Normal range of motion. Neck supple.  Cardiovascular: Normal rate and normal heart sounds.   Pulmonary/Chest: Effort normal and breath sounds normal.  Abdominal: Soft. He exhibits no distension. There is tenderness. There is no rebound and no guarding.  Minimal suprapubic tenderness, no other tenderness  Neurological: He is alert and oriented to person, place, and time.  Skin: Skin is warm.       Assessment:     Sigmoid diverticulitis, improving    Plan:     He has had 3 episodes, this recent one requiring hospitalization. He is interested in having this portion of his colon removed. I discussed with him we need to wait until the inflammation calms down.. See him back next month and we will plan further. Continue low fiber diet.

## 2013-06-16 ENCOUNTER — Encounter (INDEPENDENT_AMBULATORY_CARE_PROVIDER_SITE_OTHER): Payer: Self-pay | Admitting: General Surgery

## 2013-06-16 ENCOUNTER — Ambulatory Visit (INDEPENDENT_AMBULATORY_CARE_PROVIDER_SITE_OTHER): Payer: PRIVATE HEALTH INSURANCE | Admitting: General Surgery

## 2013-06-16 VITALS — BP 102/78 | HR 82 | Temp 97.8°F | Resp 12 | Ht 67.0 in | Wt 195.2 lb

## 2013-06-16 DIAGNOSIS — K5732 Diverticulitis of large intestine without perforation or abscess without bleeding: Secondary | ICD-10-CM

## 2013-06-16 NOTE — Progress Notes (Signed)
Subjective:     Patient ID: THEDFORD BUNTON, male   DOB: Aug 14, 1965, 48 y.o.   MRN: [id number]  HPI He presents for F/U of sigmoid diverticulitis with microperforation. He previously had 2 episodes of diverticulitis treated as an outpatient ans is interested in colectomy so it does not happen again. He had a colonoscopy in [place] a year ago with removal of 12 polyps. He cannot remember the name of his GI doctor. His abdominal pain has resolved. BMs WNL.  Review of Systems     Objective:   Physical Exam  Constitutional: He appears well-developed and well-nourished.  Eyes: EOM are normal. Pupils are equal, round, and reactive to light.  Neck: Normal range of motion. Neck supple.  Cardiovascular: Normal rate, normal heart sounds and intact distal pulses.   Pulmonary/Chest: Effort normal and breath sounds normal. No respiratory distress. He has no wheezes.  Abdominal: Soft. Bowel sounds are normal. He exhibits no distension. There is no tenderness. There is no rebound and no guarding.       Assessment:     Sigmoid diverticulitis has resolved     Plan:     He wants to have an elective colectomy. I need to contact his GI physician to see if further testing is needed before we plan that. He will call and give [place] the name. I will contact that office and we will go from there. We did discuss lap assisted colectomy briefly again.

## 2013-06-16 NOTE — Patient Instructions (Signed)
Please call with the name of your GI doctor

## 2013-07-26 ENCOUNTER — Telehealth (INDEPENDENT_AMBULATORY_CARE_PROVIDER_SITE_OTHER): Payer: Self-pay

## 2013-07-26 NOTE — Telephone Encounter (Signed)
Pt called back with name of GI MD he saw in [facility] per Dr Biagio Borg request so he could call MD.  Dr Loistine Simas at [place] [phone]. Pt can be reached at [phone].

## 2013-07-27 NOTE — Telephone Encounter (Signed)
LMOM for pt to call back. Pt needs to be advised we need him to sign release of records[colonoscopy report and notes] or pt can contact Dr Marton Redwood office and have them send records to [phone] atten: Dr Grandville Silos. Their office will not release records without release since they did not refer pt to out office.

## 2013-07-27 NOTE — Telephone Encounter (Signed)
Please get his records and colonoscopy report from Dr. Gustavo Lah. Let me know when we get them if you can.Thanks

## 2013-07-27 NOTE — Telephone Encounter (Signed)
Will call pt and have him sign release. Will request records.

## 2013-08-04 NOTE — Telephone Encounter (Signed)
LMOM again stating that we need pt to come to our office to sign a release for medical records to be able to obtain records from Dr Marton Redwood office or the pt can call Dr Marton Redwood office to have the colonoscopy with notes faxed to [phone] attn:Dr Grandville Silos. I asked for the pt to let us know the decision.

## 2013-08-11 NOTE — Telephone Encounter (Signed)
Notes here from Dr Gustavo Lah. Notes and path to Dr Biagio Borg sign folder for review and recommendations.

## 2013-08-11 NOTE — Telephone Encounter (Signed)
I will review when I get back in town

## 2013-09-06 ENCOUNTER — Telehealth (INDEPENDENT_AMBULATORY_CARE_PROVIDER_SITE_OTHER): Payer: Self-pay

## 2013-09-06 NOTE — Telephone Encounter (Signed)
LMOM for pt to call back. Need to give pt the attached msg from Dr Grandville Silos and note how pt is doing.

## 2013-09-06 NOTE — Telephone Encounter (Signed)
Message copied by Dois Davenport on Mon Sep 06, 2013  4:37 PM ------      Message from: Zenovia Jarred      Created: Mon Sep 06, 2013  3:43 PM       Hello. Please call Francisco Porter and see how he is doing. I reviewed his GI doctor's notes and they felt his diverticulitis was resolved. If he is having any abdominal pain symptoms, make him an appointment to see me. Otherwise, he can call if any diverticulitis symptoms return. Thx ------

## 2013-09-07 NOTE — Telephone Encounter (Signed)
Pt called to let Dr Grandville Silos know that he has been feeling better. However, he would like to come see Dr Grandville Silos about sx. Informed pt that I would get with Jenny Reichmann and I would call him with appt time and date. Pt verbalized understanding

## 2013-09-07 NOTE — Telephone Encounter (Signed)
LMOM to call nursing triage. Inform pt of appt made to see Dr Grandville Silos on 11/03/13 @ 9:50

## 2013-11-03 ENCOUNTER — Encounter (INDEPENDENT_AMBULATORY_CARE_PROVIDER_SITE_OTHER): Payer: PRIVATE HEALTH INSURANCE | Admitting: General Surgery

## 2013-11-03 ENCOUNTER — Other Ambulatory Visit (INDEPENDENT_AMBULATORY_CARE_PROVIDER_SITE_OTHER): Payer: Self-pay | Admitting: General Surgery

## 2013-11-03 DIAGNOSIS — K5732 Diverticulitis of large intestine without perforation or abscess without bleeding: Secondary | ICD-10-CM

## 2013-11-05 ENCOUNTER — Ambulatory Visit
Admission: RE | Admit: 2013-11-05 | Discharge: 2013-11-05 | Disposition: A | Discharge status: Home / Self Care | Payer: 59 | Source: Ambulatory Visit | Attending: General Surgery | Admitting: General Surgery

## 2013-11-05 ENCOUNTER — Other Ambulatory Visit (INDEPENDENT_AMBULATORY_CARE_PROVIDER_SITE_OTHER): Payer: Self-pay | Admitting: General Surgery

## 2013-11-05 DIAGNOSIS — K5732 Diverticulitis of large intestine without perforation or abscess without bleeding: Secondary | ICD-10-CM

## 2013-11-05 MED ORDER — IOHEXOL 300 MG/ML  SOLN
100.0000 mL | Freq: Once | INTRAMUSCULAR | Status: AC | PRN
  Administered 2013-11-05: 100 mL via INTRAVENOUS

## 2013-11-16 ENCOUNTER — Encounter (HOSPITAL_COMMUNITY): Payer: Self-pay | Admitting: Pharmacy Technician

## 2013-11-23 ENCOUNTER — Encounter (HOSPITAL_COMMUNITY)
Admission: RE | Admit: 2013-11-23 | Discharge: 2013-11-23 | Disposition: A | Discharge status: Home / Self Care | Payer: 59 | Source: Ambulatory Visit | Attending: General Surgery | Admitting: General Surgery

## 2013-11-23 ENCOUNTER — Encounter (HOSPITAL_COMMUNITY): Payer: Self-pay

## 2013-11-23 HISTORY — DX: Family history of other specified conditions: Z84.89

## 2013-11-23 LAB — BASIC METABOLIC PANEL
Anion gap: 14 (ref 5–15)
BUN: 11 mg/dL (ref 6–23)
CO2: 26 mEq/L (ref 19–32)
CREATININE: 0.83 mg/dL (ref 0.50–1.35)
Calcium: 9.2 mg/dL (ref 8.4–10.5)
Chloride: 99 mEq/L (ref 96–112)
GFR calc Af Amer: >90 mL/min (ref >90)
GFR calc non Af Amer: >90 mL/min (ref >90)
Glucose, Bld: 247 mg/dL — ABNORMAL HIGH (ref 70–99)
Potassium: 4.1 mEq/L (ref 3.7–5.3)
SODIUM: 139 meq/L (ref 137–147)

## 2013-11-23 LAB — CBC
HCT: 41.5 % (ref 39.0–52.0)
Hemoglobin: 15.5 g/dL (ref 13.0–17.0)
MCH: 31.1 pg (ref 26.0–34.0)
MCHC: 36.9 g/dL — ABNORMAL HIGH (ref 30.0–36.0)
MCV: 85.4 fL (ref 78.0–100.0)
Platelets: 190 10*3/uL (ref 150–400)
RBC: 4.86 MIL/uL (ref 4.22–5.81)
RDW: 12.4 % (ref 11.5–15.5)
WBC: 5.2 10*3/uL (ref 4.0–10.5)

## 2013-11-23 NOTE — Pre-Procedure Instructions (Signed)
Francisco Porter  11/23/2013   Your procedure is scheduled on:  Monday, Nov. 2nd   Report to [facility] Admitting at  8:00 AM.  Call this number if you have problems the morning of surgery: [phone]   Remember:   Do not eat food or drink liquids after midnight Sunday.   Take these medicines the morning of surgery with A SIP OF WATER: Nothing   Do not wear jewelry, no rings or watches.  Do not wear lotions or colognes.   You may NOT  wear deodorant the day of surgery.   Men may shave face and neck.   Do not bring valuables to the hospital.  [facility] is not responsible for any belongings or valuables.               Contacts, dentures or bridgework may not be worn into surgery.  Leave suitcase in the car. After surgery it may be brought to your room.  For patients admitted to the hospital, discharge time is determined by your treatment team.    Name and phone number of your driver:    Special Instructions: "Preparing for Surgery" instruction sheet.   Please read over the following fact sheets that you were given: Pain Booklet and Surgical Site Infection Prevention

## 2013-11-28 HISTORY — PX: COLON SURGERY: SHX602

## 2013-11-28 MED ORDER — CHLORHEXIDINE GLUCONATE 4 % EX LIQD
1.0000 "application " | Freq: Once | CUTANEOUS | Status: DC
  Filled 2013-11-28: qty 15

## 2013-11-28 MED ORDER — DEXTROSE 5 % IV SOLN
2.0000 g | INTRAVENOUS | Status: AC
  Administered 2013-11-29: 2 g via INTRAVENOUS
  Filled 2013-11-28: qty 2

## 2013-11-28 NOTE — H&P (Addendum)
History of Present Illness Francisco Neri E. Grandville Silos MD; 11/03/2013 10:12 AM) Patient words: here to discuss Sx for diverticulitis.  The patient is a 48 year old male who presents with diverticulitis. Symptoms include abdominal pain and abdominal cramping, while symptoms do not include fever, chills, nausea or vomiting.patient is well known to me for prior hospitalization for diverticulitis.He was treated with antibiotics nonoperatively. He has been evaluated by his gastroenterologist in [place]. His gastroenterologist felt that he would be a candidate for sigmoid colectomy if he had any further symptoms. 2 weeks ago, he had some pain in his left lower quadrant. He had some Flagyl leftover and he took that. Pain resolved. He occasionally has some sensation in his left lower quadrant of discomfort prior to needing to have a bowel movement. He is interested in proceeding with surgery.   Other Problems Francisco Porter; 11/03/2013 10:00 AM) Anxiety Disorder Diabetes Mellitus Diverticulosis Hemorrhoids Hypercholesterolemia  Past Surgical History Francisco Porter; 11/03/2013 10:00 AM) Colon Polyp Removal - Colonoscopy Shoulder Surgery Right.  Allergies Francisco Porter; 11/03/2013 10:02 AM) Codeine Phosphate *ANALGESICS - OPIOID*  Medication History Francisco Porter; 11/03/2013 10:02 AM) Crestor (20MG  Tablet, Oral daily) Active. MetFORMIN HCl ER (OSM) (1000MG  Tablet ER 24HR, Oral daily) Active.  Social History Francisco Porter; 11/03/2013 10:00 AM) Alcohol use Occasional alcohol use. Caffeine use Carbonated beverages. No drug use Tobacco use Never smoker.  Family History Francisco Porter; 11/03/2013 10:00 AM) Anesthetic complications Mother. Arthritis Mother. Breast Cancer Mother. Colon Polyps Father. Diabetes Mellitus Father, Mother. Heart Disease Father. Heart disease in male family member before age 22 Hypertension Mother, Sister. Ischemic Bowel Disease  Father. Prostate Cancer Father. Respiratory Condition Father, Mother.  Review of Systems Francisco Neri E. Grandville Silos MD; 11/03/2013 10:13 AM) General Not Present- Appetite Loss, Chills, Fatigue, Fever, Night Sweats, Weight Gain and Weight Loss. Skin Present- Dryness. Not Present- Change in Wart/Mole, Hives, Jaundice, New Lesions, Non-Healing Wounds, Rash and Ulcer. HEENT Present- Nose Bleed and Wears glasses/contact lenses. Not Present- Earache, Hearing Loss, Hoarseness, Oral Ulcers, Ringing in the Ears, Seasonal Allergies, Sinus Pain, Sore Throat, Visual Disturbances and Yellow Eyes. Respiratory Not Present- Bloody sputum, Chronic Cough, Difficulty Breathing, Snoring and Wheezing. Cardiovascular Not Present- Chest Pain, Difficulty Breathing Lying Down, Leg Cramps, Palpitations, Rapid Heart Rate, Shortness of Breath and Swelling of Extremities. Gastrointestinal Present- Abdominal Pain and Hemorrhoids. Not Present- Bloating, Bloody Stool, Change in Bowel Habits, Chronic diarrhea, Constipation, Difficulty Swallowing, Excessive gas, Gets full quickly at meals, Indigestion, Nausea, Rectal Pain and Vomiting. Note: see history of present illness   Male Genitourinary Not Present- Blood in Urine, Change in Urinary Stream, Frequency, Impotence, Nocturia, Painful Urination, Urgency and Urine Leakage. Musculoskeletal Not Present- Back Pain, Joint Pain, Joint Stiffness, Muscle Pain, Muscle Weakness and Swelling of Extremities. Neurological Not Present- Decreased Memory, Fainting, Headaches, Numbness, Seizures, Tingling, Tremor, Trouble walking and Weakness. Psychiatric Present- Anxiety. Not Present- Bipolar, Change in Sleep Pattern, Depression, Fearful and Frequent crying. Endocrine Not Present- Cold Intolerance, Excessive Hunger, Hair Changes, Heat Intolerance, Hot flashes and New Diabetes. Hematology Not Present- Easy Bruising, Excessive bleeding, Gland problems, HIV and Persistent Infections.   Vitals Francisco Schneiders  [place]; 11/03/2013 10:02 AM) 11/03/2013 10:01 AM Weight: 194.13 lb Height: 68in Body Surface Area: 2.06 m Body Mass Index: 29.52 kg/m Temp.: 98.37F(Oral)  Pulse: 78 (Regular)  Resp.: 16 (Unlabored)  BP: 138/90 (Sitting, Left Arm, Standard)    Physical Exam (Francisco Ebert E. Grandville Silos MD; 11/03/2013 10:15 AM) General Note: no distress   Integumentary Note: warm and dry   Head  and Neck Note: normocephalic, neck is supple with no adenopathy   ENMT Note: oral mucosa moist   Chest and Lung Exam Note: clear to auscultation, no wheezing   Cardiovascular Note: reguact rate and rhythm, no murmurs, distal pulses intact   Neuropsychiatric Note: normal mood and affect   Musculoskeletal Note: no deformity or tenderness   Lymphatic Note: no supraclavicular, cervical adenopathy     Assessment & Plan Francisco Neri E. Grandville Silos MD; 11/03/2013 10:17 AM) SIGMOID DIVERTICULITIS (562.11  K57.32) Impression: With recurrent symptoms, I would plan for laparoscopic-assisted sigmoid colectomy. I am concerned about his current tenderness. Will obtain CT scan of the abdomen and pelvis to evaluate need for antibiotics and cooling off prior to single stage surgery. 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 I called him and discussed laparoscopic assisted sigmoid colectomy. Procedure, risks, and benefits discussed. Scheduled for surgery.

## 2013-11-29 ENCOUNTER — Inpatient Hospital Stay (HOSPITAL_COMMUNITY): Payer: 59 | Admitting: Certified Registered"

## 2013-11-29 ENCOUNTER — Encounter (HOSPITAL_COMMUNITY): Payer: Self-pay | Admitting: Certified Registered"

## 2013-11-29 ENCOUNTER — Inpatient Hospital Stay (HOSPITAL_COMMUNITY)
Admission: RE | Admit: 2013-11-29 | Discharge: 2013-12-04 | DRG: 331 | Disposition: A | Discharge status: Home / Self Care | Payer: 59 | Source: Ambulatory Visit | Attending: General Surgery | Admitting: General Surgery

## 2013-11-29 ENCOUNTER — Encounter (HOSPITAL_COMMUNITY)
Admission: RE | Disposition: A | Discharge status: Home / Self Care | Payer: Self-pay | Source: Ambulatory Visit | Attending: General Surgery

## 2013-11-29 DIAGNOSIS — K5730 Diverticulosis of large intestine without perforation or abscess without bleeding: Principal | ICD-10-CM | POA: Diagnosis present

## 2013-11-29 DIAGNOSIS — Z9049 Acquired absence of other specified parts of digestive tract: Secondary | ICD-10-CM

## 2013-11-29 DIAGNOSIS — E119 Type 2 diabetes mellitus without complications: Secondary | ICD-10-CM | POA: Diagnosis present

## 2013-11-29 DIAGNOSIS — K5732 Diverticulitis of large intestine without perforation or abscess without bleeding: Secondary | ICD-10-CM | POA: Diagnosis present

## 2013-11-29 HISTORY — DX: Diverticulitis of intestine, part unspecified, without perforation or abscess without bleeding: K57.92

## 2013-11-29 HISTORY — PX: LAPAROSCOPIC SIGMOID COLECTOMY: SHX5928

## 2013-11-29 LAB — GLUCOSE, CAPILLARY
GLUCOSE-CAPILLARY: 117 mg/dL — AB (ref 70–99)
GLUCOSE-CAPILLARY: 141 mg/dL — AB (ref 70–99)
Glucose-Capillary: 139 mg/dL — ABNORMAL HIGH (ref 70–99)
Glucose-Capillary: 165 mg/dL — ABNORMAL HIGH (ref 70–99)
Glucose-Capillary: 159 mg/dL — ABNORMAL HIGH (ref 70–99)

## 2013-11-29 LAB — CBC
HCT: 41.6 % (ref 39.0–52.0)
Hemoglobin: 14.9 g/dL (ref 13.0–17.0)
MCH: 30.4 pg (ref 26.0–34.0)
MCHC: 35.8 g/dL (ref 30.0–36.0)
MCV: 84.9 fL (ref 78.0–100.0)
Platelets: 177 10*3/uL (ref 150–400)
RBC: 4.9 MIL/uL (ref 4.22–5.81)
RDW: 12.4 % (ref 11.5–15.5)
WBC: 7.9 10*3/uL (ref 4.0–10.5)

## 2013-11-29 LAB — CREATININE, SERUM
Creatinine, Ser: 0.76 mg/dL (ref 0.50–1.35)
GFR calc Af Amer: >90 mL/min (ref >90)

## 2013-11-29 SURGERY — COLECTOMY, SIGMOID, LAPAROSCOPIC
Anesthesia: General | Site: Abdomen

## 2013-11-29 MED ORDER — PROPOFOL 10 MG/ML IV BOLUS
INTRAVENOUS | Status: AC
  Filled 2013-11-29: qty 20

## 2013-11-29 MED ORDER — MIDAZOLAM HCL 2 MG/2ML IJ SOLN
INTRAMUSCULAR | Status: AC
  Filled 2013-11-29: qty 2

## 2013-11-29 MED ORDER — LACTATED RINGERS IV SOLN
INTRAVENOUS | Status: DC | PRN
  Administered 2013-11-29 (×3): via INTRAVENOUS

## 2013-11-29 MED ORDER — GLYCOPYRROLATE 0.2 MG/ML IJ SOLN
INTRAMUSCULAR | Status: AC
  Filled 2013-11-29: qty 3

## 2013-11-29 MED ORDER — HYDROMORPHONE HCL 1 MG/ML IJ SOLN
INTRAMUSCULAR | Status: AC
  Filled 2013-11-29: qty 1

## 2013-11-29 MED ORDER — LIDOCAINE HCL (CARDIAC) 20 MG/ML IV SOLN
INTRAVENOUS | Status: DC | PRN
  Administered 2013-11-29: 80 mg via INTRAVENOUS

## 2013-11-29 MED ORDER — ROCURONIUM BROMIDE 100 MG/10ML IV SOLN
INTRAVENOUS | Status: DC | PRN
  Administered 2013-11-29: 10 mg via INTRAVENOUS
  Administered 2013-11-29: 20 mg via INTRAVENOUS
  Administered 2013-11-29: 5 mg via INTRAVENOUS
  Administered 2013-11-29 (×2): 10 mg via INTRAVENOUS
  Administered 2013-11-29: 40 mg via INTRAVENOUS

## 2013-11-29 MED ORDER — LACTATED RINGERS IV SOLN
INTRAVENOUS | Status: DC
  Administered 2013-11-29: 08:00:00 via INTRAVENOUS

## 2013-11-29 MED ORDER — FENTANYL CITRATE 0.05 MG/ML IJ SOLN
INTRAMUSCULAR | Status: DC | PRN
  Administered 2013-11-29 (×2): 50 ug via INTRAVENOUS
  Administered 2013-11-29: 150 ug via INTRAVENOUS
  Administered 2013-11-29: 50 ug via INTRAVENOUS
  Administered 2013-11-29: 100 ug via INTRAVENOUS
  Administered 2013-11-29 (×2): 50 ug via INTRAVENOUS

## 2013-11-29 MED ORDER — BUPIVACAINE-EPINEPHRINE (PF) 0.25% -1:200000 IJ SOLN
INTRAMUSCULAR | Status: AC
  Filled 2013-11-29: qty 30

## 2013-11-29 MED ORDER — NEOSTIGMINE METHYLSULFATE 10 MG/10ML IV SOLN
INTRAVENOUS | Status: AC
  Filled 2013-11-29: qty 1

## 2013-11-29 MED ORDER — CETYLPYRIDINIUM CHLORIDE 0.05 % MT LIQD
7.0000 mL | Freq: Two times a day (BID) | OROMUCOSAL | Status: DC
  Administered 2013-11-29 – 2013-12-02 (×6): 7 mL via OROMUCOSAL

## 2013-11-29 MED ORDER — NEOSTIGMINE METHYLSULFATE 10 MG/10ML IV SOLN
INTRAVENOUS | Status: DC | PRN
  Administered 2013-11-29: 4 mg via INTRAVENOUS

## 2013-11-29 MED ORDER — GLYCOPYRROLATE 0.2 MG/ML IJ SOLN
INTRAMUSCULAR | Status: DC | PRN
  Administered 2013-11-29: 0.6 mg via INTRAVENOUS

## 2013-11-29 MED ORDER — HYDROMORPHONE HCL 1 MG/ML IJ SOLN
0.5000 mg | INTRAMUSCULAR | Status: DC | PRN
  Administered 2013-11-29 (×2): 0.5 mg via INTRAVENOUS

## 2013-11-29 MED ORDER — ROCURONIUM BROMIDE 50 MG/5ML IV SOLN
INTRAVENOUS | Status: AC
  Filled 2013-11-29: qty 1

## 2013-11-29 MED ORDER — DIPHENHYDRAMINE HCL 50 MG/ML IJ SOLN: 12.5000 mg | Freq: Four times a day (QID) | INTRAMUSCULAR | Status: DC | PRN

## 2013-11-29 MED ORDER — DIPHENHYDRAMINE HCL 12.5 MG/5ML PO ELIX: 12.5000 mg | ORAL_SOLUTION | Freq: Four times a day (QID) | ORAL | Status: DC | PRN

## 2013-11-29 MED ORDER — SODIUM CHLORIDE 0.9 % IJ SOLN: 9.0000 mL | INTRAMUSCULAR | Status: DC | PRN

## 2013-11-29 MED ORDER — NALOXONE HCL 0.4 MG/ML IJ SOLN
0.4000 mg | INTRAMUSCULAR | Status: DC | PRN
Start: 2013-11-29 — End: 2013-11-30

## 2013-11-29 MED ORDER — PHENYLEPHRINE 40 MCG/ML (10ML) SYRINGE FOR IV PUSH (FOR BLOOD PRESSURE SUPPORT)
PREFILLED_SYRINGE | INTRAVENOUS | Status: AC
  Filled 2013-11-29: qty 10

## 2013-11-29 MED ORDER — ALVIMOPAN 12 MG PO CAPS
12.0000 mg | ORAL_CAPSULE | Freq: Once | ORAL | Status: AC
  Administered 2013-11-29: 12 mg via ORAL
  Filled 2013-11-29: qty 1

## 2013-11-29 MED ORDER — FENTANYL CITRATE 0.05 MG/ML IJ SOLN
INTRAMUSCULAR | Status: AC
  Filled 2013-11-29: qty 5

## 2013-11-29 MED ORDER — DIPHENHYDRAMINE HCL 50 MG/ML IJ SOLN
12.5000 mg | Freq: Four times a day (QID) | INTRAMUSCULAR | Status: DC | PRN
  Administered 2013-11-30 (×2): 12.5 mg via INTRAVENOUS
  Filled 2013-11-29 (×2): qty 1

## 2013-11-29 MED ORDER — FENTANYL 10 MCG/ML IV SOLN
INTRAVENOUS | Status: DC
  Administered 2013-11-29: 22:00:00 via INTRAVENOUS
  Administered 2013-11-30: 205.9 ug/h via INTRAVENOUS
  Administered 2013-11-30: 06:00:00 via INTRAVENOUS
  Administered 2013-11-30: 210 ug/h via INTRAVENOUS
  Filled 2013-11-29 (×3): qty 50

## 2013-11-29 MED ORDER — ALVIMOPAN 12 MG PO CAPS
12.0000 mg | ORAL_CAPSULE | Freq: Two times a day (BID) | ORAL | Status: DC
  Administered 2013-11-30 – 2013-12-02 (×6): 12 mg via ORAL
  Filled 2013-11-29 (×9): qty 1

## 2013-11-29 MED ORDER — FENTANYL CITRATE 0.05 MG/ML IJ SOLN
INTRAMUSCULAR | Status: AC
  Filled 2013-11-29: qty 2

## 2013-11-29 MED ORDER — MIDAZOLAM HCL 5 MG/5ML IJ SOLN
INTRAMUSCULAR | Status: DC | PRN
  Administered 2013-11-29: 2 mg via INTRAVENOUS

## 2013-11-29 MED ORDER — SODIUM CHLORIDE 0.9 % IR SOLN
Status: DC | PRN
  Administered 2013-11-29 (×3): 1000 mL

## 2013-11-29 MED ORDER — ACETAMINOPHEN 325 MG PO TABS
650.0000 mg | ORAL_TABLET | Freq: Four times a day (QID) | ORAL | Status: DC | PRN
  Administered 2013-11-30 – 2013-12-01 (×5): 650 mg via ORAL
  Filled 2013-11-29 (×5): qty 2

## 2013-11-29 MED ORDER — BUPIVACAINE-EPINEPHRINE 0.25% -1:200000 IJ SOLN
INTRAMUSCULAR | Status: DC | PRN
Start: 2013-11-29 — End: 2013-11-29
  Administered 2013-11-29: 25 mL

## 2013-11-29 MED ORDER — FENTANYL 10 MCG/ML IV SOLN
INTRAVENOUS | Status: DC
  Administered 2013-11-29: 230 ug via INTRAVENOUS
  Administered 2013-11-29: 13:00:00 via INTRAVENOUS
  Filled 2013-11-29: qty 50

## 2013-11-29 MED ORDER — FENTANYL CITRATE 0.05 MG/ML IJ SOLN
25.0000 ug | INTRAMUSCULAR | Status: DC | PRN
  Administered 2013-11-29 (×3): 50 ug via INTRAVENOUS

## 2013-11-29 MED ORDER — ONDANSETRON HCL 4 MG/2ML IJ SOLN: 4.0000 mg | Freq: Four times a day (QID) | INTRAMUSCULAR | Status: DC | PRN

## 2013-11-29 MED ORDER — ENOXAPARIN SODIUM 40 MG/0.4ML ~~LOC~~ SOLN
40.0000 mg | SUBCUTANEOUS | Status: DC
  Administered 2013-11-30 – 2013-12-03 (×4): 40 mg via SUBCUTANEOUS
  Filled 2013-11-29 (×5): qty 0.4

## 2013-11-29 MED ORDER — ONDANSETRON HCL 4 MG/2ML IJ SOLN
INTRAMUSCULAR | Status: DC | PRN
  Administered 2013-11-29: 4 mg via INTRAVENOUS

## 2013-11-29 MED ORDER — SUCCINYLCHOLINE CHLORIDE 20 MG/ML IJ SOLN
INTRAMUSCULAR | Status: AC
  Filled 2013-11-29: qty 1

## 2013-11-29 MED ORDER — KCL IN DEXTROSE-NACL 20-5-0.45 MEQ/L-%-% IV SOLN
INTRAVENOUS | Status: DC
  Administered 2013-11-29 – 2013-12-02 (×9): via INTRAVENOUS
  Filled 2013-11-29 (×11): qty 1000

## 2013-11-29 MED ORDER — CHLORHEXIDINE GLUCONATE 0.12 % MT SOLN
15.0000 mL | Freq: Two times a day (BID) | OROMUCOSAL | Status: DC
  Administered 2013-11-29 – 2013-12-02 (×6): 15 mL via OROMUCOSAL
  Filled 2013-11-29 (×5): qty 15

## 2013-11-29 MED ORDER — INSULIN ASPART 100 UNIT/ML ~~LOC~~ SOLN
0.0000 [IU] | SUBCUTANEOUS | Status: DC
  Administered 2013-11-29: 1 [IU] via SUBCUTANEOUS
  Administered 2013-11-29 – 2013-11-30 (×8): 2 [IU] via SUBCUTANEOUS
  Administered 2013-12-01: 1 [IU] via SUBCUTANEOUS
  Administered 2013-12-01: 2 [IU] via SUBCUTANEOUS
  Administered 2013-12-01: 1 [IU] via SUBCUTANEOUS
  Administered 2013-12-01: 2 [IU] via SUBCUTANEOUS
  Administered 2013-12-01: 3 [IU] via SUBCUTANEOUS
  Administered 2013-12-02: 2 [IU] via SUBCUTANEOUS
  Administered 2013-12-02 (×3): 1 [IU] via SUBCUTANEOUS

## 2013-11-29 MED ORDER — SODIUM CHLORIDE 0.9 % IJ SOLN
9.0000 mL | INTRAMUSCULAR | Status: DC | PRN
Start: 2013-11-29 — End: 2013-11-30

## 2013-11-29 MED ORDER — PROPOFOL 10 MG/ML IV BOLUS
INTRAVENOUS | Status: DC | PRN
  Administered 2013-11-29: 200 mg via INTRAVENOUS

## 2013-11-29 MED ORDER — SUCCINYLCHOLINE CHLORIDE 20 MG/ML IJ SOLN
INTRAMUSCULAR | Status: DC | PRN
  Administered 2013-11-29: 150 mg via INTRAVENOUS

## 2013-11-29 MED ORDER — NALOXONE HCL 0.4 MG/ML IJ SOLN: 0.4000 mg | INTRAMUSCULAR | Status: DC | PRN

## 2013-11-29 SURGICAL SUPPLY — 87 items
APPLIER CLIP 5 13 M/L LIGAMAX5 (MISCELLANEOUS)
APPLIER CLIP ROT 10 11.4 M/L (STAPLE)
APR CLP MED LRG 11.4X10 (STAPLE)
APR CLP MED LRG 5 ANG JAW (MISCELLANEOUS)
BLADE SURG 10 STRL SS (BLADE) ×1 IMPLANT
BLADE SURG ROTATE 9660 (MISCELLANEOUS) ×1 IMPLANT
CANISTER SUCTION 2500CC (MISCELLANEOUS) ×3 IMPLANT
CELLS DAT CNTRL 66122 CELL SVR (MISCELLANEOUS) IMPLANT
CHLORAPREP W/TINT 26ML (MISCELLANEOUS) ×2 IMPLANT
CLIP APPLIE 5 13 M/L LIGAMAX5 (MISCELLANEOUS) IMPLANT
CLIP APPLIE ROT 10 11.4 M/L (STAPLE) IMPLANT
COVER MAYO STAND STRL (DRAPES) ×4 IMPLANT
COVER SURGICAL LIGHT HANDLE (MISCELLANEOUS) ×4 IMPLANT
DRAPE LAPAROSCOPIC ABDOMINAL (DRAPES) ×2 IMPLANT
DRAPE PROXIMA HALF (DRAPES) ×3 IMPLANT
DRAPE UTILITY 15X26 W/TAPE STR (DRAPE) ×10 IMPLANT
DRAPE WARM FLUID 44X44 (DRAPE) ×2 IMPLANT
DRSG OPSITE POSTOP 4X10 (GAUZE/BANDAGES/DRESSINGS) ×1 IMPLANT
DRSG OPSITE POSTOP 4X8 (GAUZE/BANDAGES/DRESSINGS) IMPLANT
ELECT BLADE 6.5 EXT (BLADE) ×2 IMPLANT
ELECT CAUTERY BLADE 6.4 (BLADE) ×4 IMPLANT
ELECT REM PT RETURN 9FT ADLT (ELECTROSURGICAL) ×2
ELECTRODE REM PT RTRN 9FT ADLT (ELECTROSURGICAL) ×1 IMPLANT
GAUZE SPONGE 2X2 8PLY STRL LF (GAUZE/BANDAGES/DRESSINGS) IMPLANT
GEL ULTRASOUND 20GR AQUASONIC (MISCELLANEOUS) IMPLANT
GLOVE BIO SURGEON STRL SZ7 (GLOVE) ×3 IMPLANT
GLOVE BIO SURGEON STRL SZ8 (GLOVE) ×4 IMPLANT
GLOVE BIOGEL PI IND STRL 6.5 (GLOVE) IMPLANT
GLOVE BIOGEL PI IND STRL 7.0 (GLOVE) IMPLANT
GLOVE BIOGEL PI IND STRL 7.5 (GLOVE) IMPLANT
GLOVE BIOGEL PI IND STRL 8 (GLOVE) ×2 IMPLANT
GLOVE BIOGEL PI INDICATOR 6.5 (GLOVE) ×2
GLOVE BIOGEL PI INDICATOR 7.0 (GLOVE) ×2
GLOVE BIOGEL PI INDICATOR 7.5 (GLOVE) ×1
GLOVE BIOGEL PI INDICATOR 8 (GLOVE) ×2
GLOVE BIOGEL PI ORTHO PRO SZ7 (GLOVE) ×2
GLOVE ECLIPSE 7.5 STRL STRAW (GLOVE) ×1 IMPLANT
GLOVE EUDERMIC 7 POWDERFREE (GLOVE) ×2 IMPLANT
GLOVE PI ORTHO PRO STRL SZ7 (GLOVE) IMPLANT
GOWN STRL REUS W/ TWL LRG LVL3 (GOWN DISPOSABLE) ×6 IMPLANT
GOWN STRL REUS W/ TWL XL LVL3 (GOWN DISPOSABLE) ×2 IMPLANT
GOWN STRL REUS W/TWL LRG LVL3 (GOWN DISPOSABLE) ×12
GOWN STRL REUS W/TWL XL LVL3 (GOWN DISPOSABLE) ×8
KIT BASIN OR (CUSTOM PROCEDURE TRAY) ×2 IMPLANT
LEGGING LITHOTOMY PAIR STRL (DRAPES) ×2 IMPLANT
LIGASURE IMPACT 36 18CM CVD LR (INSTRUMENTS) ×1 IMPLANT
NS IRRIG 1000ML POUR BTL (IV SOLUTION) ×5 IMPLANT
PAD ARMBOARD 7.5X6 YLW CONV (MISCELLANEOUS) ×4 IMPLANT
PENCIL BUTTON HOLSTER BLD 10FT (ELECTRODE) ×4 IMPLANT
RELOAD PROXIMATE 75MM BLUE (ENDOMECHANICALS) ×4 IMPLANT
RELOAD STAPLE 75 3.8 BLU REG (ENDOMECHANICALS) IMPLANT
RETRACTOR WND ALEXIS 18 MED (MISCELLANEOUS) IMPLANT
RTRCTR WOUND ALEXIS 18CM MED (MISCELLANEOUS)
SCALPEL HARMONIC ACE (MISCELLANEOUS) ×2 IMPLANT
SCISSORS LAP 5X35 DISP (ENDOMECHANICALS) ×2 IMPLANT
SET IRRIG TUBING LAPAROSCOPIC (IRRIGATION / IRRIGATOR) IMPLANT
SLEEVE ENDOPATH XCEL 5M (ENDOMECHANICALS) ×4 IMPLANT
SPECIMEN JAR LARGE (MISCELLANEOUS) ×2 IMPLANT
SPONGE GAUZE 2X2 STER 10/PKG (GAUZE/BANDAGES/DRESSINGS) ×1
STAPLER GUN LINEAR PROX 60 (STAPLE) ×1 IMPLANT
STAPLER PROXIMATE 75MM BLUE (STAPLE) ×1 IMPLANT
STAPLER VISISTAT 35W (STAPLE) ×2 IMPLANT
SUCTION POOLE TIP (SUCTIONS) ×2 IMPLANT
SURGILUBE 2OZ TUBE FLIPTOP (MISCELLANEOUS) ×2 IMPLANT
SUT PDS AB 1 TP1 96 (SUTURE) ×4 IMPLANT
SUT PROLENE 2 0 CT2 30 (SUTURE) IMPLANT
SUT PROLENE 2 0 KS (SUTURE) IMPLANT
SUT SILK 2 0 SH CR/8 (SUTURE) ×2 IMPLANT
SUT SILK 2 0 TIES 10X30 (SUTURE) ×2 IMPLANT
SUT SILK 3 0 SH CR/8 (SUTURE) ×2 IMPLANT
SUT SILK 3 0 TIES 10X30 (SUTURE) ×2 IMPLANT
SYR BULB IRRIGATION 50ML (SYRINGE) ×2 IMPLANT
SYS LAPSCP GELPORT 120MM (MISCELLANEOUS)
SYSTEM LAPSCP GELPORT 120MM (MISCELLANEOUS) IMPLANT
TAPE CLOTH SURG 4X10 WHT LF (GAUZE/BANDAGES/DRESSINGS) ×1 IMPLANT
TOWEL OR 17X26 10 PK STRL BLUE (TOWEL DISPOSABLE) ×4 IMPLANT
TRAY FOLEY CATH 16FRSI W/METER (SET/KITS/TRAYS/PACK) ×2 IMPLANT
TRAY LAPAROSCOPIC (CUSTOM PROCEDURE TRAY) ×2 IMPLANT
TRAY PROCTOSCOPIC FIBER OPTIC (SET/KITS/TRAYS/PACK) ×2 IMPLANT
TROCAR XCEL 12X100 BLDLESS (ENDOMECHANICALS) IMPLANT
TROCAR XCEL BLUNT TIP 100MML (ENDOMECHANICALS) ×1 IMPLANT
TROCAR XCEL NON-BLD 11X100MML (ENDOMECHANICALS) IMPLANT
TROCAR XCEL NON-BLD 5MMX100MML (ENDOMECHANICALS) ×2 IMPLANT
TUBE CONNECTING 12X1/4 (SUCTIONS) ×4 IMPLANT
TUBING FILTER THERMOFLATOR (ELECTROSURGICAL) ×2 IMPLANT
TUBING INSUFFLATION (TUBING) ×2 IMPLANT
YANKAUER SUCT BULB TIP NO VENT (SUCTIONS) ×4 IMPLANT

## 2013-11-29 NOTE — Interval H&P Note (Signed)
History and Physical Interval Note:  11/29/2013 9:12 AM  Francisco Porter  has presented today for surgery, with the diagnosis of history of diverticulitis  The various methods of treatment have been discussed with the patient and family. After consideration of risks, benefits and other options for treatment, the patient has consented to  Procedure(s): LAPAROSCOPIC ASSISTED SIGMOID COLECTOMY (N/A) as a surgical intervention .  The patient's history has been reviewed, patient examined, no change in status, stable for surgery.  I have reviewed the patient's chart and labs.  Questions were answered to the patient's satisfaction.     [name]

## 2013-11-29 NOTE — Anesthesia Preprocedure Evaluation (Addendum)
Anesthesia Evaluation  Patient identified by MRN, date of birth, ID band Patient awake    Reviewed: Allergy & Precautions, H&P , NPO status , Patient's Chart, lab work & pertinent test results  History of Anesthesia Complications (+) Family history of anesthesia reaction  Airway Mallampati: II  TM Distance: >3 FB Neck ROM: Full    Dental   Pulmonary neg pulmonary ROS,          Cardiovascular negative cardio ROS  Rhythm:Regular Rate:Normal     Neuro/Psych    GI/Hepatic Neg liver ROS, GI history noted. CE   Endo/Other  diabetes, Well Controlled, Type 2, Oral Hypoglycemic Agents  Renal/GU negative Renal ROS     Musculoskeletal   Abdominal   Peds  Hematology   Anesthesia Other Findings   Reproductive/Obstetrics                           Anesthesia Physical Anesthesia Plan  ASA: III  Anesthesia Plan: General   Post-op Pain Management:    Induction: Intravenous  Airway Management Planned: Oral ETT  Additional Equipment:   Intra-op Plan:   Post-operative Plan: Extubation in OR  Informed Consent: I have reviewed the patients History and Physical, chart, labs and discussed the procedure including the risks, benefits and alternatives for the proposed anesthesia with the patient or authorized representative who has indicated his/her understanding and acceptance.   Dental advisory given  Plan Discussed with: CRNA and Anesthesiologist  Anesthesia Plan Comments:         Anesthesia Quick Evaluation

## 2013-11-29 NOTE — Anesthesia Procedure Notes (Signed)
Procedure Name: Intubation Date/Time: 11/29/2013 9:52 AM Performed by: Maeola Harman Pre-anesthesia Checklist: Patient identified, Emergency Drugs available, Suction available, Patient being monitored and Timeout performed Patient Re-evaluated:Patient Re-evaluated prior to inductionOxygen Delivery Method: Circle system utilized Preoxygenation: Pre-oxygenation with 100% oxygen Intubation Type: IV induction Ventilation: Mask ventilation without difficulty Laryngoscope Size: Mac and 3 Grade View: Grade I Tube type: Oral Tube size: 7.5 mm Number of attempts: 1 Airway Equipment and Method: Stylet Placement Confirmation: ETT inserted through vocal cords under direct vision,  positive ETCO2 and breath sounds checked- equal and bilateral Secured at: 22 cm Tube secured with: Tape Dental Injury: Teeth and Oropharynx as per pre-operative assessment  Comments: Easy atraumatic induction and intubation with MAC 3 blade.  Dr. Oletta Lamas verified placement of ETT .  Waldron Session, CRNA

## 2013-11-29 NOTE — Anesthesia Postprocedure Evaluation (Signed)
  Anesthesia Post-op Note  Patient: Francisco Porter  Procedure(s) Performed: Procedure(s): LAPAROSCOPIC ASSISTED SIGMOID COLECTOMY (N/A)  Patient Location: PACU  Anesthesia Type:General  Level of Consciousness: awake and alert   Airway and Oxygen Therapy: Patient Spontanous Breathing  Post-op Pain: mild  Post-op Assessment: Post-op Vital signs reviewed  Post-op Vital Signs: stable  Last Vitals:  Filed Vitals:   11/29/13 1400  BP: 149/86  Pulse: 86  Temp:   Resp: 19    Complications: No apparent anesthesia complications

## 2013-11-29 NOTE — Op Note (Signed)
11/29/2013  12:16 PM  PATIENT:  Francisco Porter  48 y.o. male  PRE-OPERATIVE DIAGNOSIS:  history of diverticulitis  POST-OPERATIVE DIAGNOSIS:  history of diverticulitis  PROCEDURE:  Procedure(s): LAPAROSCOPIC ASSISTED SIGMOID COLECTOMY  SURGEON:  Georganna Skeans, MD  ASSISTANTS: Fanny Skates, MD   ANESTHESIA:   local and general  EBL:  Total I/O In: 2000 [I.V.:2000] Out: 200 [Urine:150; Blood:50]  BLOOD ADMINISTERED:none  DRAINS: none   SPECIMEN:  Excision  DISPOSITION OF SPECIMEN:  PATHOLOGY  COUNTS:  YES  DICTATION: Viviann Spare Dictation patient presents for laparoscopic-assisted sigmoid colectomy for a history of diverticulitis. He was identified in the preop holding area. He received Entereg. He tolerated his prep. Informed consent was obtained. He received intravenous antibiotics. He was brought to the operating room and general endotracheal anesthesia was administered by the anesthesia staff. He was placed in lithotomy position. Abdomen was prepped and draped in sterile fashion. Time out procedure was performed. Foley catheter was placed by nursing. Abdomen was prepped and draped in a sterile fashion. Infra-umbilical region was infiltrated with local. Infra-umbilical incision was made. Subcutaneous tissues were dissected down revealing the anterior fascia. This was divided sharply along the midline. Peritoneal cavity was entered under direct vision. 0 Vicryl  pursestring suture was placed around the fascial opening. Hassan trocar was inserted. Abdomen was insufflated with carbon dioxide in standard fashion. Under direct vision, 5 mm ports were placed in left upper quadrant. Bilateral lower quadrants. And lower midline. Patient wants position. The sigmoid colon was inspected. There was one area with some adhesions that appeared to be the location of his previous perforation. The sigmoid was then mobilized from its lateral peritoneal attachments along the line of Toldt. We stayed away  from the region of the ureter which was inspected. We continued our mobilization laterally extending up the left gutter. We got to the region of the splenium were able to do some mobilization medially of the splenic flexure. At this time, we attempted to further mobilize the splenic flexure coming medially. We were able to free it up somewhat but not completely. Worked at this some time but got some mobilization but not complete mobilization. We stopped making progress decision was made to just proceed with our lower midline as planned. Limited lower midline incision was made encompassing her previous port sites along the midline. Insufflation was released. Subcutis tissues were dissected down revealing the anterior fascia. This was divided along the midline continuing from the [place] port site down to the suprapubic region. We then inspected the sigmoid colon. It was somewhat short. It had been mobilized a fair amount from above. The site of the previous perforation was located with a thickened area in the mesentery. The distal sigmoid and rectum appeared without significant diverticular disease. Decision was made to take out the area of previous issue and some surrounding sigmoid colon and then perform anastomosis. We divided the distal sigmoid with GIA-75 stapler. Mesentery was taken down with LigaSure achieving good hemostasis. We got well proximal of the previous perforation site. And divided the proximal sigmoid at that location with GIA-75 stapler. We had enough mobilization from above to do a side-to-side anastomosis. We placed special drapes for the colon Protocol.Stay sutures of 2-0 silk were placed. Side-to-side anastomosis was achieved with GIA-75 stapler. There was no bleeding along the internal staple line.Common defect was closed with TA 60. This gave a good sized anastomosis. Hemostasis was achieved along the staple line with 3-0 silk sutures. Mesenteric defect was closed with  2-0 silk sutures. The  abdomen was copiously irrigated. We checked the region in the splenic flexure area there was no significant bleeding.We then changed our gowns and gloves. We changed the drape and instruments according to the colon Protocol. Anastomosis was rechecked and it was viable and pink. There was a nice patency. There was no bleeding. Fascia was closed from each end with running #1 PDS and tied in the middle. Subcutaneous tissues were irrigated. The midline and 3 remaining port sites were closed with staples. All counts were correct. Patient tolerated the procedure well without apparent complication and was taken recovery in stable condition.  PATIENT DISPOSITION:  PACU - hemodynamically stable.   Delay start of Pharmacological VTE agent (>24hrs) due to surgical blood loss or risk of bleeding:  no  Georganna Skeans, MD, MPH, FACS Pager: [phone]  11/2/201512:16 PM

## 2013-11-29 NOTE — Transfer of Care (Signed)
Immediate Anesthesia Transfer of Care Note  Patient: Francisco Porter  Procedure(s) Performed: Procedure(s): LAPAROSCOPIC ASSISTED SIGMOID COLECTOMY (N/A)  Patient Location: PACU  Anesthesia Type:General  Level of Consciousness: awake, alert  and oriented  Airway & Oxygen Therapy: Patient connected to face mask oxygen  Post-op Assessment: Report given to PACU RN  Post vital signs: stable  Complications: No apparent anesthesia complications

## 2013-11-30 LAB — BASIC METABOLIC PANEL
Anion gap: 12 (ref 5–15)
BUN: 6 mg/dL (ref 6–23)
CALCIUM: 8.4 mg/dL (ref 8.4–10.5)
CO2: 25 mEq/L (ref 19–32)
Chloride: 103 mEq/L (ref 96–112)
Creatinine, Ser: 0.82 mg/dL (ref 0.50–1.35)
GFR calc non Af Amer: >90 mL/min (ref >90)
Glucose, Bld: 173 mg/dL — ABNORMAL HIGH (ref 70–99)
POTASSIUM: 4.2 meq/L (ref 3.7–5.3)
Sodium: 140 mEq/L (ref 137–147)

## 2013-11-30 LAB — CBC
HCT: 42 % (ref 39.0–52.0)
Hemoglobin: 14.9 g/dL (ref 13.0–17.0)
MCH: 30.4 pg (ref 26.0–34.0)
MCHC: 35.5 g/dL (ref 30.0–36.0)
MCV: 85.7 fL (ref 78.0–100.0)
Platelets: 159 10*3/uL (ref 150–400)
RBC: 4.9 MIL/uL (ref 4.22–5.81)
RDW: 12.5 % (ref 11.5–15.5)
WBC: 6.4 10*3/uL (ref 4.0–10.5)

## 2013-11-30 LAB — GLUCOSE, CAPILLARY
GLUCOSE-CAPILLARY: 172 mg/dL — AB (ref 70–99)
Glucose-Capillary: 171 mg/dL — ABNORMAL HIGH (ref 70–99)
Glucose-Capillary: 163 mg/dL — ABNORMAL HIGH (ref 70–99)
Glucose-Capillary: 157 mg/dL — ABNORMAL HIGH (ref 70–99)
Glucose-Capillary: 178 mg/dL — ABNORMAL HIGH (ref 70–99)
Glucose-Capillary: 177 mg/dL — ABNORMAL HIGH (ref 70–99)

## 2013-11-30 MED ORDER — HYDROMORPHONE HCL 1 MG/ML IJ SOLN
1.0000 mg | Freq: Once | INTRAMUSCULAR | Status: AC
  Administered 2013-11-30: 1 mg via INTRAVENOUS
  Filled 2013-11-30: qty 1

## 2013-11-30 MED ORDER — METHOCARBAMOL 1000 MG/10ML IJ SOLN
500.0000 mg | Freq: Four times a day (QID) | INTRAMUSCULAR | Status: DC | PRN
  Filled 2013-11-30: qty 5

## 2013-11-30 MED ORDER — HYDROMORPHONE 0.3 MG/ML IV SOLN
INTRAVENOUS | Status: DC
  Administered 2013-11-30: 08:00:00 via INTRAVENOUS
  Administered 2013-11-30: 2.4 mg via INTRAVENOUS
  Administered 2013-11-30: 3 mg via INTRAVENOUS
  Administered 2013-11-30: 0.3 mg via INTRAVENOUS
  Administered 2013-11-30: 1.2 mg via INTRAVENOUS
  Administered 2013-12-01: 0.9 mg via INTRAVENOUS
  Filled 2013-11-30 (×2): qty 25

## 2013-11-30 MED ORDER — SODIUM CHLORIDE 0.9 % IJ SOLN: 9.0000 mL | INTRAMUSCULAR | Status: DC | PRN

## 2013-11-30 MED ORDER — DIPHENHYDRAMINE HCL 12.5 MG/5ML PO ELIX: 12.5000 mg | ORAL_SOLUTION | Freq: Four times a day (QID) | ORAL | Status: DC | PRN

## 2013-11-30 MED ORDER — DIPHENHYDRAMINE HCL 50 MG/ML IJ SOLN
12.5000 mg | Freq: Four times a day (QID) | INTRAMUSCULAR | Status: DC | PRN
  Administered 2013-11-30 (×2): 12.5 mg via INTRAVENOUS
  Filled 2013-11-30 (×2): qty 1

## 2013-11-30 MED ORDER — ONDANSETRON HCL 4 MG/2ML IJ SOLN
4.0000 mg | Freq: Four times a day (QID) | INTRAMUSCULAR | Status: DC | PRN
  Administered 2013-11-30 – 2013-12-01 (×2): 4 mg via INTRAVENOUS
  Filled 2013-11-30 (×2): qty 2

## 2013-11-30 MED ORDER — NALOXONE HCL 0.4 MG/ML IJ SOLN: 0.4000 mg | INTRAMUSCULAR | Status: DC | PRN

## 2013-11-30 NOTE — Progress Notes (Signed)
1 Day Post-Op  Subjective: Pain poorly controlled on fentanyl PCA.   Objective: Vital signs in last 24 hours: Temp:  [97.6 F (36.4 C)-98.8 F (37.1 C)] 98.8 F (37.1 C) (11/03 0621) Pulse Rate:  [72-98] 96 (11/03 0621) Resp:  [14-27] 19 (11/03 0621) BP: (125-162)/(73-93) 136/85 mmHg (11/03 0621) SpO2:  [90 %-100 %] 94 % (11/03 0621) Weight:  [195 lb (88.451 kg)-200 lb (90.719 kg)] 195 lb (88.451 kg) (11/02 1700) Last BM Date: 11/29/13  Intake/Output from previous day: 11/02 0701 - 11/03 0700 In: 3568.3 [P.O.:60; I.V.:3508.3] Out: 2650 [Urine:2600; Blood:50] Intake/Output this shift: Total I/O In: -  Out: 200 [Urine:200]  General appearance: alert and cooperative Resp: clear to auscultation bilaterally Cardio: regular rate and rhythm GI: soft, mild dist, a few BS, dressings with min dry stain  Lab Results:   Recent Labs  11/29/13 1848 11/30/13 0536  WBC 7.9 6.4  HGB 14.9 14.9  HCT 41.6 42.0  PLT 177 159   BMET  Recent Labs  11/29/13 1848 11/30/13 0536  NA  --  140  K  --  4.2  CL  --  103  CO2  --  25  GLUCOSE  --  173*  BUN  --  6  CREATININE 0.76 0.82  CALCIUM  --  8.4   PT/INR No results for input(s): LABPROT, INR in the last 72 hours. ABG No results for input(s): PHART, HCO3 in the last 72 hours.  Invalid input(s): PCO2, PO2  Studies/Results: No results found.  Anti-infectives: Anti-infectives    Start     Dose/Rate Route Frequency Ordered Stop   11/29/13 0600  cefOXitin (MEFOXIN) 2 g in dextrose 5 % 50 mL IVPB     2 g100 mL/hr over 30 Minutes Intravenous On call to O.R. 11/28/13 1334 11/29/13 1023      Assessment/Plan: S/P lap assisted sigmoid colectomy POD#1 GI - Entereg, ice Inadequate pain control - change to dilaudid PCA DM - SSI FEN - lytes OK VTE - Lovenox  LOS: 1 day    Francisco Porter E 11/30/2013

## 2013-11-30 NOTE — Progress Notes (Signed)
37mL fentanyl PCA wasted in sink with Dalene Seltzer, Therapist, sports. Syliva Overman

## 2013-11-30 NOTE — Plan of Care (Signed)
Problem: Phase I Progression Outcomes Goal: Sutures/staples intact Outcome: Not Applicable Date Met:  26/08/88 Goal: Voiding-avoid urinary catheter unless indicated Outcome: Completed/Met Date Met:  11/30/13 Goal: Other Phase I Outcomes/Goals Outcome: Completed/Met Date Met:  11/30/13  Problem: Phase II Progression Outcomes Goal: Pain controlled Outcome: Completed/Met Date Met:  11/30/13 Goal: Progressing with IS, TCDB Outcome: Completed/Met Date Met:  11/30/13 Goal: Vital signs stable Outcome: Completed/Met Date Met:  11/30/13 Goal: Surgical site without signs of infection Outcome: Completed/Met Date Met:  11/30/13 Goal: Dressings dry/intact Outcome: Completed/Met Date Met:  11/30/13 Goal: Sutures/staples intact Outcome: Not Applicable Date Met:  35/84/46 Goal: Foley discontinued Outcome: Completed/Met Date Met:  11/30/13

## 2013-11-30 NOTE — Progress Notes (Signed)
Patient ID: Francisco Porter, male   DOB: October 09, 1965, 48 y.o.   MRN: [id number] Pain control much better on Dilaudid. Passing urine OK. Benadryl PRN itching. Georganna Skeans, MD, MPH, FACS Trauma: [phone] General Surgery: [phone]

## 2013-11-30 NOTE — Plan of Care (Signed)
Problem: Phase I Progression Outcomes Goal: Tubes/drains patent Outcome: Completed/Met Date Met:  11/30/13

## 2013-11-30 NOTE — Plan of Care (Signed)
Problem: Phase I Progression Outcomes Goal: Incision/dressings dry and intact Outcome: Completed/Met Date Met:  11/30/13 Goal: Vital signs/hemodynamically stable Outcome: Completed/Met Date Met:  11/30/13

## 2013-12-01 ENCOUNTER — Encounter (HOSPITAL_COMMUNITY): Payer: Self-pay | Admitting: General Surgery

## 2013-12-01 LAB — BASIC METABOLIC PANEL
Anion gap: 12 (ref 5–15)
BUN: 5 mg/dL — ABNORMAL LOW (ref 6–23)
CO2: 27 meq/L (ref 19–32)
CREATININE: 0.72 mg/dL (ref 0.50–1.35)
Calcium: 8.7 mg/dL (ref 8.4–10.5)
Chloride: 99 mEq/L (ref 96–112)
GFR calc Af Amer: >90 mL/min (ref >90)
GFR calc non Af Amer: >90 mL/min (ref >90)
GLUCOSE: 196 mg/dL — AB (ref 70–99)
Potassium: 4.2 mEq/L (ref 3.7–5.3)
SODIUM: 138 meq/L (ref 137–147)

## 2013-12-01 LAB — GLUCOSE, CAPILLARY
GLUCOSE-CAPILLARY: 150 mg/dL — AB (ref 70–99)
Glucose-Capillary: 134 mg/dL — ABNORMAL HIGH (ref 70–99)
Glucose-Capillary: 136 mg/dL — ABNORMAL HIGH (ref 70–99)
Glucose-Capillary: 176 mg/dL — ABNORMAL HIGH (ref 70–99)
Glucose-Capillary: 195 mg/dL — ABNORMAL HIGH (ref 70–99)
Glucose-Capillary: 208 mg/dL — ABNORMAL HIGH (ref 70–99)

## 2013-12-01 LAB — CBC
HCT: 42.4 % (ref 39.0–52.0)
HEMOGLOBIN: 14.6 g/dL (ref 13.0–17.0)
MCH: 30.5 pg (ref 26.0–34.0)
MCHC: 34.4 g/dL (ref 30.0–36.0)
MCV: 88.7 fL (ref 78.0–100.0)
Platelets: 161 10*3/uL (ref 150–400)
RBC: 4.78 MIL/uL (ref 4.22–5.81)
RDW: 12.6 % (ref 11.5–15.5)
WBC: 8.5 10*3/uL (ref 4.0–10.5)

## 2013-12-01 MED ORDER — IBUPROFEN 600 MG PO TABS
600.0000 mg | ORAL_TABLET | Freq: Three times a day (TID) | ORAL | Status: DC | PRN
  Administered 2013-12-01 – 2013-12-02 (×2): 600 mg via ORAL
  Filled 2013-12-01 (×2): qty 1

## 2013-12-01 MED ORDER — MORPHINE SULFATE 2 MG/ML IJ SOLN
2.0000 mg | INTRAMUSCULAR | Status: DC | PRN
  Administered 2013-12-01 (×3): 2 mg via INTRAVENOUS
  Filled 2013-12-01 (×4): qty 1

## 2013-12-01 NOTE — Plan of Care (Signed)
Problem: Phase I Progression Outcomes Goal: Pain controlled with appropriate interventions Outcome: Completed/Met Date Met:  12/01/13     

## 2013-12-01 NOTE — Progress Notes (Signed)
Patient encouraged and offered to ambulate  Multiple times by staff but patient refusing.

## 2013-12-01 NOTE — Progress Notes (Signed)
2 Days Post-Op  Subjective: HA from Dilaudid, no flatus yet  Objective: Vital signs in last 24 hours: Temp:  [98 F (36.7 C)-99.5 F (37.5 C)] 98.2 F (36.8 C) (11/04 0550) Pulse Rate:  [93-99] 93 (11/04 0550) Resp:  [14-20] 15 (11/04 0550) BP: (127-148)/(78-92) 127/78 mmHg (11/04 0550) SpO2:  [91 %-98 %] 95 % (11/04 0550) FiO2 (%):  [2 %-56 %] 2 % (11/03 2335) Last BM Date: 11/29/13  Intake/Output from previous day: 11/03 0701 - 11/04 0700 In: 1500 [I.V.:1500] Out: 2675 [Urine:2675] Intake/Output this shift:    General appearance: cooperative Resp: clear to auscultation bilaterally Cardio: regular rate and rhythm GI: soft, mild dist, +BS, dressings removed from port sites  Lab Results:   Recent Labs  11/30/13 0536 12/01/13 0422  WBC 6.4 8.5  HGB 14.9 14.6  HCT 42.0 42.4  PLT 159 161   BMET  Recent Labs  11/30/13 0536 12/01/13 0422  NA 140 138  K 4.2 4.2  CL 103 99  CO2 25 27  GLUCOSE 173* 196*  BUN 6 5*  CREATININE 0.82 0.72  CALCIUM 8.4 8.7   PT/INR No results for input(s): LABPROT, INR in the last 72 hours. ABG No results for input(s): PHART, HCO3 in the last 72 hours.  Invalid input(s): PCO2, PO2  Studies/Results: No results found.  Anti-infectives: Anti-infectives    Start     Dose/Rate Route Frequency Ordered Stop   11/29/13 0600  cefOXitin (MEFOXIN) 2 g in dextrose 5 % 50 mL IVPB     2 g100 mL/hr over 30 Minutes Intravenous On call to O.R. 11/28/13 1334 11/29/13 1023      Assessment/Plan: S/P lap assisted sigmoid colectomy POD#2 GI - Entereg, ice HA from Dilaudid - change to morphine pushes, Tylenol PRN DM - SSI FEN - lytes OK, decrease IVF a bit VTE - Lovenox  LOS: 2 days    Francisco Porter E 12/01/2013

## 2013-12-02 LAB — GLUCOSE, CAPILLARY
GLUCOSE-CAPILLARY: 130 mg/dL — AB (ref 70–99)
GLUCOSE-CAPILLARY: 153 mg/dL — AB (ref 70–99)
GLUCOSE-CAPILLARY: 123 mg/dL — AB (ref 70–99)
Glucose-Capillary: 133 mg/dL — ABNORMAL HIGH (ref 70–99)
Glucose-Capillary: 118 mg/dL — ABNORMAL HIGH (ref 70–99)
Glucose-Capillary: 154 mg/dL — ABNORMAL HIGH (ref 70–99)

## 2013-12-02 MED ORDER — INSULIN ASPART 100 UNIT/ML ~~LOC~~ SOLN
0.0000 [IU] | Freq: Every day | SUBCUTANEOUS | Status: DC
  Administered 2013-12-03: 2 [IU] via SUBCUTANEOUS

## 2013-12-02 MED ORDER — OXYCODONE-ACETAMINOPHEN 5-325 MG PO TABS
1.0000 | ORAL_TABLET | ORAL | Status: DC | PRN
  Administered 2013-12-02 – 2013-12-03 (×6): 2 via ORAL
  Filled 2013-12-02 (×6): qty 2

## 2013-12-02 MED ORDER — INSULIN ASPART 100 UNIT/ML ~~LOC~~ SOLN
0.0000 [IU] | Freq: Three times a day (TID) | SUBCUTANEOUS | Status: DC
  Administered 2013-12-03: 2 [IU] via SUBCUTANEOUS
  Administered 2013-12-03: 1 [IU] via SUBCUTANEOUS
  Administered 2013-12-03: 2 [IU] via SUBCUTANEOUS

## 2013-12-02 NOTE — Plan of Care (Signed)
Problem: Phase I Progression Outcomes Goal: OOB as tolerated unless otherwise ordered Outcome: Completed/Met Date Met:  12/02/13     

## 2013-12-02 NOTE — Progress Notes (Signed)
3 Days Post-Op  Subjective: Passing gas, HA gone, feels better  Objective: Vital signs in last 24 hours: Temp:  [98.1 F (36.7 C)-99.1 F (37.3 C)] 98.1 F (36.7 C) (11/05 0526) Pulse Rate:  [84-96] 84 (11/05 0526) Resp:  [16] 16 (11/05 0526) BP: (126-133)/(75-84) 126/78 mmHg (11/05 0526) SpO2:  [91 %-95 %] 95 % (11/05 0526) Last BM Date: 11/29/13  Intake/Output from previous day: 11/04 0701 - 11/05 0700 In: 1106.3 [P.O.:230; I.V.:876.3] Out: 2550 [Urine:2550] Intake/Output this shift:    General appearance: alert and cooperative Resp: clear to auscultation bilaterally Cardio: regular rate and rhythm GI: softer, active BS, dressing OK  Lab Results:   Recent Labs  11/30/13 0536 12/01/13 0422  WBC 6.4 8.5  HGB 14.9 14.6  HCT 42.0 42.4  PLT 159 161   BMET  Recent Labs  11/30/13 0536 12/01/13 0422  NA 140 138  K 4.2 4.2  CL 103 99  CO2 25 27  GLUCOSE 173* 196*  BUN 6 5*  CREATININE 0.82 0.72  CALCIUM 8.4 8.7   PT/INR No results for input(s): LABPROT, INR in the last 72 hours. ABG No results for input(s): PHART, HCO3 in the last 72 hours.  Invalid input(s): PCO2, PO2  Studies/Results: No results found.  Anti-infectives: Anti-infectives    Start     Dose/Rate Route Frequency Ordered Stop   11/29/13 0600  cefOXitin (MEFOXIN) 2 g in dextrose 5 % 50 mL IVPB     2 g100 mL/hr over 30 Minutes Intravenous On call to O.R. 11/28/13 1334 11/29/13 1023      Assessment/Plan: S/P lap assisted sigmoid colectomy POD#3 GI - Entereg, clears Pain control - morphine pushes, add percocet DM - SSI FEN - lytes OK, decrease IVF VTE - Lovenox  LOS: 3 days    Mehreen Azizi E 12/02/2013

## 2013-12-02 NOTE — Progress Notes (Signed)
Patient ambulated the hall this evening with family member.  He walked around a total of 100 ft.  Patient reported passing gas after the walk.  He tolerated walk well and was able to have one person assist.

## 2013-12-03 LAB — GLUCOSE, CAPILLARY
GLUCOSE-CAPILLARY: 207 mg/dL — AB (ref 70–99)
Glucose-Capillary: 128 mg/dL — ABNORMAL HIGH (ref 70–99)
Glucose-Capillary: 165 mg/dL — ABNORMAL HIGH (ref 70–99)
Glucose-Capillary: 165 mg/dL — ABNORMAL HIGH (ref 70–99)

## 2013-12-03 MED ORDER — MORPHINE SULFATE 2 MG/ML IJ SOLN
2.0000 mg | Freq: Once | INTRAMUSCULAR | Status: AC
  Administered 2013-12-03: 2 mg via INTRAMUSCULAR

## 2013-12-03 MED ORDER — ALUM & MAG HYDROXIDE-SIMETH 200-200-20 MG/5ML PO SUSP
30.0000 mL | Freq: Four times a day (QID) | ORAL | Status: DC | PRN
  Administered 2013-12-03: 30 mL via ORAL
  Filled 2013-12-03: qty 30

## 2013-12-03 NOTE — Progress Notes (Signed)
4 Days Post-Op  Subjective: 3BM, percocet working well for pain  Objective: Vital signs in last 24 hours: Temp:  [97.6 F (36.4 C)-98 F (36.7 C)] 97.8 F (36.6 C) (11/06 0552) Pulse Rate:  [69-80] 69 (11/06 0552) Resp:  [16] 16 (11/06 0552) BP: (123-142)/(84-90) 142/88 mmHg (11/06 0552) SpO2:  [94 %-98 %] 98 % (11/06 0552) Last BM Date: 12/02/13  Intake/Output from previous day: 11/05 0701 - 11/06 0700 In: 1547.2 [P.O.:480; I.V.:1067.2] Out: 2325 [Urine:2325] Intake/Output this shift:    General appearance: cooperative Resp: clear to auscultation bilaterally Cardio: regular rate and rhythm GI: soft, incisions CDI, active BS  Lab Results:   Recent Labs  12/01/13 0422  WBC 8.5  HGB 14.6  HCT 42.4  PLT 161   BMET  Recent Labs  12/01/13 0422  NA 138  K 4.2  CL 99  CO2 27  GLUCOSE 196*  BUN 5*  CREATININE 0.72  CALCIUM 8.7   PT/INR No results for input(s): LABPROT, INR in the last 72 hours. ABG No results for input(s): PHART, HCO3 in the last 72 hours.  Invalid input(s): PCO2, PO2  Studies/Results: No results found.  Anti-infectives: Anti-infectives    Start     Dose/Rate Route Frequency Ordered Stop   11/29/13 0600  cefOXitin (MEFOXIN) 2 g in dextrose 5 % 50 mL IVPB     2 g100 mL/hr over 30 Minutes Intravenous On call to O.R. 11/28/13 1334 11/29/13 1023      Assessment/Plan: S/P lap assisted sigmoid colectomy POD#4 GI - reg diet, D/C Entereg DM - SSI FEN - saline lock VTE - Lovenox Dispo - home this PM if tolerates diet  LOS: 4 days    Anushree Dorsi E 12/03/2013

## 2013-12-03 NOTE — Plan of Care (Signed)
Problem: Phase II Progression Outcomes Goal: Return of bowel function (flatus, BM) IF ABDOMINAL SURGERY:  Outcome: Completed/Met Date Met:  12/03/13

## 2013-12-03 NOTE — Progress Notes (Signed)
Patient ID: Francisco Porter, male   DOB: Mar 28, 1965, 48 y.o.   MRN: [id number] Had another BM. Still quite a bit of gas pains. Percocet helping. Plan D/C in AM if continues to tolerate diet.I also spoke with his wife. Georganna Skeans, MD, MPH, FACS Trauma: [phone] General Surgery: [phone]

## 2013-12-03 NOTE — Plan of Care (Signed)
Problem: Phase II Progression Outcomes Goal: Tolerating diet Outcome: Completed/Met Date Met:  12/03/13     

## 2013-12-04 LAB — GLUCOSE, CAPILLARY: Glucose-Capillary: 129 mg/dL — ABNORMAL HIGH (ref 70–99)

## 2013-12-04 MED ORDER — OXYCODONE-ACETAMINOPHEN 5-325 MG PO TABS: 1.0000 | ORAL_TABLET | ORAL | Status: DC | PRN

## 2013-12-04 NOTE — Discharge Summary (Signed)
Physician Discharge Summary  Patient ID: Francisco Porter MRN: [id number] DOB/AGE: 48/13/1967 48 y.o.  Admit date: 11/29/2013 Discharge date: 12/04/2013  Admission Diagnoses:recurrent sigmoid diverticulitis  Discharge Diagnoses: status post laparoscopic-assisted sigmoid colectomy Active Problems:   S/P laparoscopic-assisted sigmoidectomy   Discharged Condition: good  Hospital Course: patient underwent laparoscopic-assisted sigmoid colectomy. Initially, he did not tolerate Dilaudid due to headaches. Pain was managed with morphine.Postoperatively his bowel function gradually returned. He began having bowel movements and his diet was gradually advanced. He tolerated Percocet for pain relief. He had some gas pains but these have resolved and he has passed a lot of gas this morning.  Consults: None  Significant Diagnostic Studies: labs were okay  Treatments: surgery: above  Discharge Exam: Blood pressure 143/91, pulse 79, temperature 98 F (36.7 C), temperature source Oral, resp. rate 17, height 5\' 8"  (1.727 m), weight 195 lb (88.451 kg), SpO2 97 %. General appearance: cooperative Resp: clear to auscultation bilaterally Cardio: regular rate and rhythm GI: soft, nondistended, incisions clean dry and intact with staples, nontender  Disposition: 01-Home or Self Care  Discharge Instructions    Diet - low sodium heart healthy    Complete by:  As directed      Discharge instructions    Complete by:  As directed   No heavy lifting. Avoid driving until not taking much Percocet and able to safely operate a vehicle.     Increase activity slowly    Complete by:  As directed      No dressing needed    Complete by:  As directed             Medication List    TAKE these medications        CRESTOR 10 MG tablet  Generic drug:  rosuvastatin  Take 10 mg by mouth daily.     metFORMIN 500 MG tablet  Commonly known as:  GLUCOPHAGE  Take 500 mg by mouth 2 (two) times daily with a meal.     oxyCODONE-acetaminophen 5-325 MG per tablet  Commonly known as:  PERCOCET/ROXICET  Take 1-2 tablets by mouth every 4 (four) hours as needed (pain).           Follow-up Information    Follow up with Zenovia Jarred, MD.   Specialty:  General Surgery   Why:  My office will call you Monday to set up an appointment on 11/11   Contact information:   [address] [place] [address] [phone]       Signed: Zenovia Jarred 12/04/2013, 8:10 AM

## 2013-12-04 NOTE — Progress Notes (Signed)
Administered Morphine 2mg  IM at 2102H and patient noted to forcefully walk inside bedroom.  Patient then noted to go to bathroom and reported that he had burp twice which caused some relief and pain level reduced.  Patient stated, "I'm feeling much better now"

## 2013-12-04 NOTE — Progress Notes (Addendum)
Patient complained of gas pain at right upper quadrant.  Encouraged the patient to ambulate to let the gas out, oral pain meds and Maalox are not yet due and no more IV site.  However, patient unable to walk straight due to pain but tried.  Paged on call MD and new order received for Morphine 2mg  IM.

## 2013-12-08 ENCOUNTER — Other Ambulatory Visit (INDEPENDENT_AMBULATORY_CARE_PROVIDER_SITE_OTHER): Payer: Self-pay

## 2013-12-08 DIAGNOSIS — R300 Dysuria: Secondary | ICD-10-CM

## 2015-03-16 ENCOUNTER — Encounter: Payer: Self-pay | Admitting: General Surgery

## 2015-03-22 ENCOUNTER — Encounter: Payer: Self-pay | Admitting: General Surgery

## 2015-03-22 ENCOUNTER — Ambulatory Visit (INDEPENDENT_AMBULATORY_CARE_PROVIDER_SITE_OTHER): Payer: 59 | Admitting: General Surgery

## 2015-03-22 VITALS — BP 140/82 | HR 76 | Resp 12 | Ht 67.0 in | Wt 193.0 lb

## 2015-03-22 DIAGNOSIS — K439 Ventral hernia without obstruction or gangrene: Secondary | ICD-10-CM | POA: Insufficient documentation

## 2015-03-22 NOTE — H&P (Signed)
HPI  Francisco Porter is a 50 y.o. male. Here today for evaluation of a possible right sided dominant wall hernia. Patient states he noticed this area very shortly after his laparoscopically assisted sigmoid colectomy done in 2015 for recurrent diverticulitis. He states he has had more discomfort in the last year from the right lower quadrant prominence.  The patient is accompanied today by his wife who was present for the interview and exam.  HPI  Past Medical History   Diagnosis  Date   .  Hyperlipidemia    .  Family history of anesthesia complication      his mother has heart mur mur   .  Diverticulitis    .  Hiatal hernia    .  Diabetes mellitus without complication ([place])      9935    Past Surgical History   Procedure  Laterality  Date   .  R shoulder surgery     .  Laparoscopic sigmoid colectomy  N/A  11/29/2013     Dr Thompson/Procedure: LAPAROSCOPIC ASSISTED SIGMOID COLECTOMY; Surgeon: Georganna Skeans, MD; Location: [facility] OR; Service: General; Laterality: N/A;   .  Colonoscopy   2014    Family History   Problem  Relation  Age of Onset   .  Bowel Disease  Father    .  Diabetes  Mother    .  Diabetes  Father     Social History  Social History   Substance Use Topics   .  Smoking status:  Never Smoker   .  Smokeless tobacco:  Current User     Types:  Chew   .  Alcohol Use:  Yes      Comment: 18 packs/week    Allergies   Allergen  Reactions   .  Codeine  Hives and Rash    Current Outpatient Prescriptions   Medication  Sig  Dispense  Refill   .  CRESTOR 10 MG tablet  Take 10 mg by mouth daily.     .  metFORMIN (GLUCOPHAGE) 500 MG tablet  Take 500 mg by mouth 2 (two) times daily with a meal.      No current facility-administered medications for this visit.    Review of Systems  Review of Systems  Constitutional: Negative.  Respiratory: Negative.  Cardiovascular: Negative.   Blood pressure 140/82, pulse 76, resp. rate 12, height _0  (1.702 m), weight 193 lb (87.544 kg).    Physical Exam  Physical Exam  Constitutional: He is oriented to person, place, and time. He appears well-developed and well-nourished.  Eyes: Conjunctivae are normal.  Neck: Neck supple.  Cardiovascular: Normal rate and normal heart sounds.  Pulmonary/Chest: Effort normal and breath sounds normal.  Abdominal: Soft. Normal appearance and bowel sounds are normal. There is no hepatomegaly. There is tenderness in the left lower quadrant. A hernia is present. Hernia confirmed positive in the ventral area.    Lymphadenopathy:  He has no cervical adenopathy.  Neurological: He is alert and oriented to person, place, and time.  Skin: Skin is warm and dry.   Data Reviewed  11/05/2013 CT reviewed pre-colon resection.  11/30/2015 operative note and pathology report reviewed. Focal sigmoid colon thickening related to diverticulitis. Limited resection. No malignancy.  Assessment   Suspected ventral hernia, unlikely Spigelian or inguinal hernia.   Plan   CT scan prior to surgical intervention has been recommended to confirm the actual source of the defect.  Likely candidate for open repair with prosthetic  mesh placement based on young age and strenuous activity in including golf and deep sea fishing.  Hernia precautions and incarceration were discussed with the patient. If they develop symptoms of an incarcerated hernia, they were encouraged to seek prompt medical attention.  I have recommended repair of the hernia using mesh on an outpatient basis in the near future. The risk of infection was reviewed. The role of prosthetic mesh to minimize the risk of recurrence was reviewed.  Patient has been scheduled for a CT abdomen/pelvis with contrast at [place] for 1:00 pm (arrive 12:45 pm). Prep: NPO 4 hours prior and pick up prep kit. Patient verbalizes understanding.  This patient's surgery has been scheduled for 04-07-15 at [facility].  PCP: [place] Pink  .This information has been  scribed by Gaspar Cola CMA.  Robert Bellow  03/22/2015, 4:52 PM

## 2015-03-22 NOTE — Progress Notes (Signed)
Patient ID: Francisco Porter, male   DOB: [birth year], 50 y.o.   MRN: [id number]  Chief Complaint  Patient presents with  . Hernia    HPI Francisco Porter is a 50 y.o. male.  Here today for evaluation of a possible right sided dominant wall hernia. Patient states he noticed this area very shortly after his laparoscopically assisted sigmoid colectomy done in 2015 for recurrent diverticulitis. He states he has had more discomfort in the last year from the right lower quadrant prominence.   The patient is accompanied today by his wife who was present for the interview and exam.   HPI  Past Medical History  Diagnosis Date  . Hyperlipidemia   . Family history of anesthesia complication     his mother has heart mur mur  . Diverticulitis   . Hiatal hernia   . Diabetes mellitus without complication ([place])     6270    Past Surgical History  Procedure Laterality Date  . R shoulder surgery    . Laparoscopic sigmoid colectomy N/A 11/29/2013    Dr Thompson/Procedure: LAPAROSCOPIC ASSISTED SIGMOID COLECTOMY;  Surgeon: Georganna Skeans, MD;  Location: [facility] OR;  Service: General;  Laterality: N/A;  . Colonoscopy  2014    Family History  Problem Relation Age of Onset  . Bowel Disease Father   . Diabetes Mother   . Diabetes Father     Social History Social History  Substance Use Topics  . Smoking status: Never Smoker   . Smokeless tobacco: Current User    Types: Chew  . Alcohol Use: Yes     Comment: 18 packs/week    Allergies  Allergen Reactions  . Codeine Hives and Rash    Current Outpatient Prescriptions  Medication Sig Dispense Refill  . CRESTOR 10 MG tablet Take 10 mg by mouth daily.     . metFORMIN (GLUCOPHAGE) 500 MG tablet Take 500 mg by mouth 2 (two) times daily with a meal.     No current facility-administered medications for this visit.    Review of Systems Review of Systems  Constitutional: Negative.   Respiratory: Negative.   Cardiovascular: Negative.     Blood  pressure 140/82, pulse 76, resp. rate 12, height _0  (1.702 m), weight 193 lb (87.544 kg).  Physical Exam Physical Exam  Constitutional: He is oriented to person, place, and time. He appears well-developed and well-nourished.  Eyes: Conjunctivae are normal.  Neck: Neck supple.  Cardiovascular: Normal rate and normal heart sounds.   Pulmonary/Chest: Effort normal and breath sounds normal.  Abdominal: Soft. Normal appearance and bowel sounds are normal. There is no hepatomegaly. There is tenderness in the left lower quadrant. A hernia is present. Hernia confirmed positive in the ventral area.    Lymphadenopathy:    He has no cervical adenopathy.  Neurological: He is alert and oriented to person, place, and time.  Skin: Skin is warm and dry.    Data Reviewed 11/05/2013 CT reviewed pre-colon resection.  11/30/2015 operative note and pathology report reviewed. Focal sigmoid colon thickening related to diverticulitis. Limited resection. No malignancy.  Assessment    Suspected ventral hernia, unlikely Spigelian or  inguinal hernia.     Plan    CT scan prior to surgical intervention has been recommended to confirm the actual source of the defect.  Likely candidate for open repair with prosthetic mesh placement based on young age and strenuous activity in including golf and deep sea fishing.    Hernia precautions and incarceration  were discussed with the patient. If they develop symptoms of an incarcerated hernia, they were encouraged to seek prompt medical attention.  I have recommended repair of the hernia using mesh on an outpatient basis in the near future. The risk of infection was reviewed. The role of prosthetic mesh to minimize the risk of recurrence was reviewed.  Patient has been scheduled for a CT abdomen/pelvis with contrast at [place] for 1:00 pm (arrive 12:45 pm). Prep: NPO 4 hours prior and pick up prep kit. Patient verbalizes understanding.  This  patient's surgery has been scheduled for 04-07-15 at [facility].    PCP:  [place] Pink  .This information has been scribed by Gaspar Cola CMA.  Robert Bellow 03/22/2015, 4:52 PM

## 2015-03-22 NOTE — Patient Instructions (Addendum)
The patient is aware to call back for any questions or concerns. Laparoscopic Ventral Hernia Repair Laparoscopic ventral hernia repairis a surgery to fix a ventral hernia. Aventral hernia, also called an incisional hernia, is a bulge of body tissue or intestines that pushes through the front part of the abdomen. This can happen if the connective tissue covering the muscles over the abdomen has a weak spot or is torn because of a surgical cut (incision) from a previous surgery. Laparoscopic ventral hernia repair is often done soon after diagnosis to stop the hernia from getting bigger, becoming uncomfortable, or becoming an emergency. This surgery usually takes about 2 hours, but the time can vary greatly. LET [facility] CARE PROVIDER KNOW ABOUT:  Any allergies you have.  All medicines you are taking, including steroids, vitamins, herbs, eye drops, creams, and over-the-counter medicines.  Previous problems you or members of your family have had with the use of anesthetics.  Any blood disorders you have.  Previous surgeries you have had.  Medical conditions you have. RISKS AND COMPLICATIONS  Generally, laparoscopic ventral hernia repair is a safe procedure. However, as with any surgical procedure, problems can occur. Possible problems include:  Bleeding.  Trouble passing urine or having a bowel movement after the surgery.  Infection.  Pneumonia.  Blood clots.  Pain in the area of the hernia.  A bulge in the area of the hernia that may be caused by a collection of fluid.  Injury to intestines or other structures in the abdomen.  Return of the hernia after surgery. In some cases, your health care provider may need to stop the laparoscopic procedure and do regular, open surgery. This may be necessary for very difficult hernias, when organs are hard to see, or when bleeding problems occur during surgery. BEFORE THE PROCEDURE   You may need to have blood tests, urine tests, a chest  X-ray, or an electrocardiogram done before the day of the surgery.  Ask your health care provider about changing or stopping your regular medicines. This is especially important if you are taking diabetes medicines or blood thinners.  You may need to wash with a special type of germ-killing soap.  Do not eat or drink anything after midnight the night before the procedure or as directed by your health care provider.  Make plans to have someone drive you home after the procedure. PROCEDURE   Small monitors will be put on your body. They are used to check your heart, blood pressure, and oxygen level.  An IV access tube will be put into a vein in your hand or arm. Fluids and medicine will flow directly into your body through the IV tube.  You will be given medicine that makes you go to sleep (general anesthetic).  Your abdomen will be cleaned with a special soap to kill any germs on your skin.  Once you are asleep, several small incisions will be made in your abdomen.  The large space in your abdomen will be filled with air so that it expands. This gives your health care provider more room and a better view.  A thin, lighted tube with a tiny camera on the end (laparoscope) is put through a small incision in your abdomen. The camera on the laparoscope sends a picture to a TV screen in the operating room. This gives your health care provider a good view inside your abdomen.  Hollow tubes are put through the other small incisions in your abdomen. The tools needed for  the procedure are put through these tubes.  Your health care provider puts the tissue or intestines that formed the hernia back in place.  A screen-like patch (mesh) is used to close the hernia. This helps make the area stronger. Stitches, tacks, or staples are used to keep the mesh in place.  Medicine and a bandage (dressing) or skin glue will be put over the incisions. AFTER THE PROCEDURE   You will stay in a recovery area  until the anesthetic wears off. Your blood pressure and pulse will be checked often.  You may be able to go home the same day or may need to stay in the hospital for 1-2 days after surgery. Your health care provider will decide when you can go home.  You may feel some pain. You may be given medicine for pain.  You will be urged to do breathing exercises that involve taking deep breaths. This helps prevent a lung infection after a surgery.  You may have to wear compression stockings while you are in the hospital. These stockings help keep blood clots from forming in your legs.   This information is not intended to replace advice given to you by your health care provider. Make sure you discuss any questions you have with your health care provider.   Document Released: 01/01/2012 Document Revised: 01/19/2013 Document Reviewed: 01/01/2012 Elsevier Interactive Patient Education [employer].  Patient has been scheduled for a CT abdomen/pelvis with contrast at [place] for 1:00 pm (arrive 12:45 pm). Prep: NPO 4 hours prior and pick up prep kit. Patient verbalizes understanding.  This patient's surgery has been scheduled for 04-07-15 at [facility].

## 2015-03-28 ENCOUNTER — Ambulatory Visit
Admission: RE | Admit: 2015-03-28 | Discharge: 2015-03-28 | Disposition: A | Discharge status: Home / Self Care | Payer: 59 | Source: Ambulatory Visit | Attending: General Surgery | Admitting: General Surgery

## 2015-03-28 DIAGNOSIS — K439 Ventral hernia without obstruction or gangrene: Secondary | ICD-10-CM | POA: Insufficient documentation

## 2015-03-28 DIAGNOSIS — K5730 Diverticulosis of large intestine without perforation or abscess without bleeding: Secondary | ICD-10-CM | POA: Diagnosis not present

## 2015-03-28 DIAGNOSIS — K760 Fatty (change of) liver, not elsewhere classified: Secondary | ICD-10-CM | POA: Diagnosis not present

## 2015-03-28 DIAGNOSIS — Z9889 Other specified postprocedural states: Secondary | ICD-10-CM | POA: Diagnosis not present

## 2015-03-28 DIAGNOSIS — K429 Umbilical hernia without obstruction or gangrene: Secondary | ICD-10-CM | POA: Diagnosis not present

## 2015-03-28 LAB — POCT I-STAT CREATININE: CREATININE: 0.9 mg/dL (ref 0.61–1.24)

## 2015-03-28 MED ORDER — IOHEXOL 350 MG/ML SOLN
100.0000 mL | Freq: Once | INTRAVENOUS | Status: AC | PRN
  Administered 2015-03-28: 100 mL via INTRAVENOUS

## 2015-03-31 ENCOUNTER — Inpatient Hospital Stay
Admission: RE | Admit: 2015-03-31 | Discharge: 2015-03-31 | Disposition: A | Discharge status: Home / Self Care | Payer: 59 | Source: Ambulatory Visit

## 2015-03-31 DIAGNOSIS — Z01810 Encounter for preprocedural cardiovascular examination: Secondary | ICD-10-CM | POA: Diagnosis present

## 2015-03-31 NOTE — Patient Instructions (Addendum)
  Your procedure is scheduled on: 04/07/15 Report to Day Surgery. To find out your arrival time please call [phone] between 1PM - 3PM on 04/06/15  Remember: Instructions that are not followed completely may result in serious medical risk, up to and including death, or upon the discretion of your surgeon and anesthesiologist your surgery may need to be rescheduled.    _x___ 1. Do not eat food or drink liquids after midnight. No gum chewing or hard candies.     _x___ 2. No Alcohol for 24 hours before or after surgery.   ____ 3. Bring all medications with you on the day of surgery if instructed.    __x__ 4. Notify your doctor if there is any change in your medical condition     (cold, fever, infections).     Do not wear jewelry, make-up, hairpins, clips or nail polish.  Do not wear lotions, powders, or perfumes. You may wear deodorant.  Do not shave 48 hours prior to surgery. Men may shave face and neck.  Do not bring valuables to the hospital.    [facility] is not responsible for any belongings or valuables.               Contacts, dentures or bridgework may not be worn into surgery.  Leave your suitcase in the car. After surgery it may be brought to your room.  For patients admitted to the hospital, discharge time is determined by your                treatment team.   Patients discharged the day of surgery will not be allowed to drive home.   Please read over the following fact sheets that you were given:   Surgical Site Infection Prevention   ____ Take these medicines the morning of surgery with A SIP OF WATER:    1.   2.   3.   4.  5.  6.  ____ Fleet Enema (as directed)   __x__ Use CHG Soap as directed  ____ Use inhalers on the day of surgery  __x__ Stop metformin 2 days prior to surgery    ____ Take 1/2 of usual insulin dose the night before surgery and none on the morning of surgery.   ____ Stop Coumadin/Plavix/aspirin on  __x__ Stop Anti-inflammatories on  tylenol only after 3/6   ____ Stop supplements until after surgery.    ____ Bring C-Pap to the hospital.

## 2015-04-03 ENCOUNTER — Encounter: Payer: Self-pay | Admitting: General Surgery

## 2015-04-03 ENCOUNTER — Encounter
Admission: RE | Admit: 2015-04-03 | Discharge: 2015-04-03 | Disposition: A | Discharge status: Home / Self Care | Payer: 59 | Source: Ambulatory Visit | Attending: General Surgery | Admitting: General Surgery

## 2015-04-03 ENCOUNTER — Ambulatory Visit (INDEPENDENT_AMBULATORY_CARE_PROVIDER_SITE_OTHER): Payer: 59 | Admitting: General Surgery

## 2015-04-03 VITALS — BP 120/78 | HR 78 | Resp 14 | Ht 67.0 in | Wt 193.0 lb

## 2015-04-03 DIAGNOSIS — K439 Ventral hernia without obstruction or gangrene: Secondary | ICD-10-CM | POA: Diagnosis not present

## 2015-04-03 DIAGNOSIS — Z01810 Encounter for preprocedural cardiovascular examination: Secondary | ICD-10-CM | POA: Insufficient documentation

## 2015-04-03 NOTE — Progress Notes (Deleted)
Patient ID: Francisco Porter, male   DOB: 21-Jan-1966, 50 y.o.   MRN: [id number]  Chief Complaint  Patient presents with  . Pre-op Exam    HPI Francisco Porter is a 50 y.o. male here today to discuss ct scan done on 03/28/15. HPI  Past Medical History  Diagnosis Date  . Hyperlipidemia   . Family history of anesthesia complication     his mother has heart mur mur  . Diverticulitis   . Diabetes mellitus without complication ([place])     [id number]    Past Surgical History  Procedure Laterality Date  . R shoulder surgery    . Laparoscopic sigmoid colectomy N/A 11/29/2013    Dr Thompson/Procedure: LAPAROSCOPIC ASSISTED SIGMOID COLECTOMY;  Surgeon: Georganna Skeans, MD;  Location: [facility] OR;  Service: General;  Laterality: N/A;  . Colonoscopy  2014    Family History  Problem Relation Age of Onset  . Bowel Disease Father   . Diabetes Mother   . Diabetes Father     Social History Social History  Substance Use Topics  . Smoking status: Never Smoker   . Smokeless tobacco: Current User    Types: Chew  . Alcohol Use: Yes     Comment: 4 beers /week    Allergies  Allergen Reactions  . Codeine Hives and Rash    Current Outpatient Prescriptions  Medication Sig Dispense Refill  . CRESTOR 10 MG tablet Take 10 mg by mouth daily.     . metFORMIN (GLUCOPHAGE) 500 MG tablet Take 500 mg by mouth 2 (two) times daily with a meal.     No current facility-administered medications for this visit.    Review of Systems Review of Systems  Constitutional: Negative.   Respiratory: Negative.   Cardiovascular: Negative.     Blood pressure 120/78, pulse 78, resp. rate 14, height 5\' 7"  (1.702 m), weight 193 lb (87.544 kg).  Physical Exam Physical Exam  Constitutional: He is oriented to person, place, and time. He appears well-developed and well-nourished.  Neurological: He is alert and oriented to person, place, and time.  Skin: Skin is warm and dry.    Data Reviewed ***  Assessment    ***     Plan    ***     PCP:  Kary Kos This information has been scribed by Gaspar Cola CMA.     Gaspar Cola 04/03/2015, 9:57 AM

## 2015-04-03 NOTE — Patient Instructions (Signed)
Patient

## 2015-04-04 NOTE — Progress Notes (Signed)
Patient ID: Francisco Porter, male   DOB: 08-06-1965, 50 y.o.   MRN: [id number]  Chief Complaint  Patient presents with  . Pre-op Exam    HPI Francisco Porter is a 50 y.o. male returns for reassessment prior to planned ventral hernia repair. His recently completed CT scan of the abdomen and pelvis failed to show a well-defined fascial defect. Repeat clinical exam was felt to be appropriate prior to surgical intervention. The patient was accompanied today by his wife who was present for the interview and exam.  I personally reviewed the patient's history HPI  Past Medical History  Diagnosis Date  . Hyperlipidemia   . Family history of anesthesia complication     his mother has heart mur mur  . Diverticulitis   . Diabetes mellitus without complication ([place])     [id number]    Past Surgical History  Procedure Laterality Date  . R shoulder surgery    . Laparoscopic sigmoid colectomy N/A 11/29/2013    Dr Thompson/Procedure: LAPAROSCOPIC ASSISTED SIGMOID COLECTOMY;  Surgeon: Georganna Skeans, MD;  Location: [facility] OR;  Service: General;  Laterality: N/A;  . Colonoscopy  2014    Family History  Problem Relation Age of Onset  . Bowel Disease Father   . Diabetes Mother   . Diabetes Father     Social History Social History  Substance Use Topics  . Smoking status: Never Smoker   . Smokeless tobacco: Current User    Types: Chew  . Alcohol Use: Yes     Comment: 4 beers /week    Allergies  Allergen Reactions  . Codeine Hives and Rash    Current Outpatient Prescriptions  Medication Sig Dispense Refill  . CRESTOR 10 MG tablet Take 10 mg by mouth daily.     . metFORMIN (GLUCOPHAGE) 500 MG tablet Take 500 mg by mouth 2 (two) times daily with a meal.     No current facility-administered medications for this visit.    Review of Systems Review of Systems  Blood pressure 120/78, pulse 78, resp. rate 14, height 5\' 7"  (1.702 m), weight 193 lb (87.544 kg).  Physical Exam Physical Exam  Abdominal:        Data Reviewed Previously completed CT scan of 03/28/2015.  Assessment    Physical exam confirms a ventral hernia not evident on CT imaging. This is probably a small fascial defect with omentum.    Plan    The procedure may include diagnostic laparoscopy or we may proceed directly to ventral hernia repair. Final decision will be made after repeat review of his CT.  Plans for apparent including possible complications related to surgery and the possible use of prosthetic mesh was reviewed.       Robert Bellow 04/04/2015, 9:21 PM

## 2015-04-05 ENCOUNTER — Other Ambulatory Visit: Payer: Self-pay | Admitting: General Surgery

## 2015-04-05 ENCOUNTER — Telehealth: Payer: Self-pay | Admitting: General Surgery

## 2015-04-05 NOTE — Telephone Encounter (Signed)
The patient was notified of plans for laparoscopy the morning of surgery to determine if other defects not appreciated on CT identified. If possible, a laparoscopic repair will be undertaken, if not open repair. The patient is in agreement.

## 2015-04-07 ENCOUNTER — Ambulatory Visit: Payer: 59 | Admitting: Anesthesiology

## 2015-04-07 ENCOUNTER — Ambulatory Visit
Admission: RE | Admit: 2015-04-07 | Discharge: 2015-04-07 | Disposition: A | Discharge status: Home / Self Care | Payer: 59 | Source: Ambulatory Visit | Attending: General Surgery | Admitting: General Surgery

## 2015-04-07 ENCOUNTER — Encounter: Payer: Self-pay | Admitting: *Deleted

## 2015-04-07 ENCOUNTER — Encounter
Admission: RE | Disposition: A | Discharge status: Home / Self Care | Payer: Self-pay | Source: Ambulatory Visit | Attending: General Surgery

## 2015-04-07 DIAGNOSIS — E119 Type 2 diabetes mellitus without complications: Secondary | ICD-10-CM | POA: Diagnosis not present

## 2015-04-07 DIAGNOSIS — K439 Ventral hernia without obstruction or gangrene: Secondary | ICD-10-CM | POA: Insufficient documentation

## 2015-04-07 DIAGNOSIS — Z9049 Acquired absence of other specified parts of digestive tract: Secondary | ICD-10-CM | POA: Insufficient documentation

## 2015-04-07 DIAGNOSIS — Z7984 Long term (current) use of oral hypoglycemic drugs: Secondary | ICD-10-CM | POA: Diagnosis not present

## 2015-04-07 DIAGNOSIS — Z720 Tobacco use: Secondary | ICD-10-CM | POA: Insufficient documentation

## 2015-04-07 DIAGNOSIS — K660 Peritoneal adhesions (postprocedural) (postinfection): Secondary | ICD-10-CM | POA: Diagnosis not present

## 2015-04-07 DIAGNOSIS — Z8379 Family history of other diseases of the digestive system: Secondary | ICD-10-CM | POA: Diagnosis not present

## 2015-04-07 DIAGNOSIS — E785 Hyperlipidemia, unspecified: Secondary | ICD-10-CM | POA: Diagnosis not present

## 2015-04-07 DIAGNOSIS — Z833 Family history of diabetes mellitus: Secondary | ICD-10-CM | POA: Diagnosis not present

## 2015-04-07 DIAGNOSIS — Z79899 Other long term (current) drug therapy: Secondary | ICD-10-CM | POA: Diagnosis not present

## 2015-04-07 HISTORY — PX: INSERTION OF MESH: SHX5868

## 2015-04-07 HISTORY — PX: HERNIA REPAIR: SHX51

## 2015-04-07 HISTORY — PX: VENTRAL HERNIA REPAIR: SHX424

## 2015-04-07 HISTORY — PX: LAPAROSCOPY: SHX197

## 2015-04-07 LAB — GLUCOSE, CAPILLARY
GLUCOSE-CAPILLARY: 190 mg/dL — AB (ref 65–99)
GLUCOSE-CAPILLARY: 204 mg/dL — AB (ref 65–99)

## 2015-04-07 SURGERY — REPAIR, HERNIA, VENTRAL
Anesthesia: General | Site: Abdomen | Wound class: Clean

## 2015-04-07 MED ORDER — BUPIVACAINE HCL (PF) 0.5 % IJ SOLN
INTRAMUSCULAR | Status: AC
  Filled 2015-04-07: qty 30

## 2015-04-07 MED ORDER — SODIUM CHLORIDE 0.9 % IV SOLN
INTRAVENOUS | Status: DC
  Administered 2015-04-07: 07:00:00 via INTRAVENOUS

## 2015-04-07 MED ORDER — LIDOCAINE HCL (CARDIAC) 20 MG/ML IV SOLN
INTRAVENOUS | Status: DC | PRN
  Administered 2015-04-07: 80 mg via INTRAVENOUS

## 2015-04-07 MED ORDER — PROPOFOL 10 MG/ML IV BOLUS
INTRAVENOUS | Status: DC | PRN
  Administered 2015-04-07: 150 mg via INTRAVENOUS

## 2015-04-07 MED ORDER — MIDAZOLAM HCL 2 MG/2ML IJ SOLN
INTRAMUSCULAR | Status: DC | PRN
  Administered 2015-04-07: 2 mg via INTRAVENOUS

## 2015-04-07 MED ORDER — FENTANYL CITRATE (PF) 100 MCG/2ML IJ SOLN
INTRAMUSCULAR | Status: DC | PRN
  Administered 2015-04-07 (×4): 50 ug via INTRAVENOUS

## 2015-04-07 MED ORDER — HYDROMORPHONE HCL 1 MG/ML IJ SOLN
INTRAMUSCULAR | Status: AC
  Administered 2015-04-07: 0.5 mg via INTRAVENOUS
  Filled 2015-04-07: qty 1

## 2015-04-07 MED ORDER — ONDANSETRON HCL 4 MG/2ML IJ SOLN: 4.0000 mg | Freq: Once | INTRAMUSCULAR | Status: DC | PRN

## 2015-04-07 MED ORDER — BUPIVACAINE-EPINEPHRINE (PF) 0.5% -1:200000 IJ SOLN
INTRAMUSCULAR | Status: AC
  Filled 2015-04-07: qty 30

## 2015-04-07 MED ORDER — KETOROLAC TROMETHAMINE 30 MG/ML IJ SOLN
INTRAMUSCULAR | Status: DC | PRN
  Administered 2015-04-07: 30 mg via INTRAVENOUS

## 2015-04-07 MED ORDER — FENTANYL CITRATE (PF) 100 MCG/2ML IJ SOLN
25.0000 ug | INTRAMUSCULAR | Status: DC | PRN
  Administered 2015-04-07 (×4): 25 ug via INTRAVENOUS

## 2015-04-07 MED ORDER — ACETAMINOPHEN 10 MG/ML IV SOLN
INTRAVENOUS | Status: DC | PRN
  Administered 2015-04-07: 1000 mg via INTRAVENOUS

## 2015-04-07 MED ORDER — PHENYLEPHRINE HCL 10 MG/ML IJ SOLN
INTRAMUSCULAR | Status: DC | PRN
  Administered 2015-04-07: 100 ug via INTRAVENOUS
  Administered 2015-04-07: 200 ug via INTRAVENOUS
  Administered 2015-04-07: 100 ug via INTRAVENOUS

## 2015-04-07 MED ORDER — FAMOTIDINE 20 MG PO TABS
ORAL_TABLET | ORAL | Status: AC
Start: 2015-04-07 — End: 2015-04-07
  Administered 2015-04-07: 20 mg via ORAL
  Filled 2015-04-07: qty 1

## 2015-04-07 MED ORDER — SUGAMMADEX SODIUM 200 MG/2ML IV SOLN
INTRAVENOUS | Status: DC | PRN
  Administered 2015-04-07: 175 mg via INTRAVENOUS

## 2015-04-07 MED ORDER — ROCURONIUM BROMIDE 100 MG/10ML IV SOLN
INTRAVENOUS | Status: DC | PRN
  Administered 2015-04-07: 20 mg via INTRAVENOUS
  Administered 2015-04-07: 30 mg via INTRAVENOUS

## 2015-04-07 MED ORDER — FENTANYL CITRATE (PF) 100 MCG/2ML IJ SOLN
INTRAMUSCULAR | Status: AC
  Administered 2015-04-07: 25 ug via INTRAVENOUS
  Filled 2015-04-07: qty 2

## 2015-04-07 MED ORDER — OXYCODONE-ACETAMINOPHEN 5-325 MG PO TABS: 1.0000 | ORAL_TABLET | ORAL | Status: DC | PRN

## 2015-04-07 MED ORDER — ACETAMINOPHEN 10 MG/ML IV SOLN
INTRAVENOUS | Status: AC
  Filled 2015-04-07: qty 100

## 2015-04-07 MED ORDER — CEFAZOLIN SODIUM-DEXTROSE 2-3 GM-% IV SOLR
2.0000 g | INTRAVENOUS | Status: AC
  Administered 2015-04-07: 2 g via INTRAVENOUS

## 2015-04-07 MED ORDER — OXYCODONE-ACETAMINOPHEN 5-325 MG PO TABS
ORAL_TABLET | ORAL | Status: AC
  Filled 2015-04-07: qty 1

## 2015-04-07 MED ORDER — ONDANSETRON HCL 4 MG/2ML IJ SOLN
INTRAMUSCULAR | Status: DC | PRN
Start: 2015-04-07 — End: 2015-04-07
  Administered 2015-04-07: 4 mg via INTRAVENOUS

## 2015-04-07 MED ORDER — HYDROMORPHONE HCL 1 MG/ML IJ SOLN
0.5000 mg | INTRAMUSCULAR | Status: DC | PRN
  Administered 2015-04-07 (×2): 0.5 mg via INTRAVENOUS

## 2015-04-07 MED ORDER — FAMOTIDINE 20 MG PO TABS
20.0000 mg | ORAL_TABLET | Freq: Once | ORAL | Status: AC
  Administered 2015-04-07: 20 mg via ORAL

## 2015-04-07 MED ORDER — CEFAZOLIN SODIUM-DEXTROSE 2-3 GM-% IV SOLR
INTRAVENOUS | Status: AC
Start: 2015-04-07 — End: 2015-04-07
  Administered 2015-04-07: 2 g via INTRAVENOUS
  Filled 2015-04-07: qty 50

## 2015-04-07 MED ORDER — OXYCODONE-ACETAMINOPHEN 5-325 MG PO TABS
1.0000 | ORAL_TABLET | Freq: Once | ORAL | Status: AC
Start: 2015-04-07 — End: 2015-04-07
  Administered 2015-04-07: 1 via ORAL

## 2015-04-07 SURGICAL SUPPLY — 57 items
BAG COUNTER SPONGE EZ (MISCELLANEOUS) IMPLANT
BAG SPNG 4X4 CLR HAZ (MISCELLANEOUS)
BLADE SURG 11 STRL SS SAFETY (MISCELLANEOUS) ×1 IMPLANT
BLADE SURG 15 STRL SS SAFETY (BLADE) ×2 IMPLANT
CANISTER SUCT 1200ML W/VALVE (MISCELLANEOUS) ×1 IMPLANT
CANNULA DILATOR 12 W/SLV (CANNULA) ×2 IMPLANT
CANNULA DILATOR 5 W/SLV (CANNULA) ×3 IMPLANT
CATH TRAY 16F METER LATEX (MISCELLANEOUS) ×1 IMPLANT
CHLORAPREP W/TINT 26ML (MISCELLANEOUS) ×3 IMPLANT
DEVICE SECURE STRAP 25 ABSORB (INSTRUMENTS) ×2 IMPLANT
DRAIN CHANNEL JP 15F RND 16 (MISCELLANEOUS) ×2 IMPLANT
DRAPE CHEST BREAST 77X106 FENE (MISCELLANEOUS) ×1 IMPLANT
DRAPE LAPAROTOMY 100X77 ABD (DRAPES) ×1 IMPLANT
DRESSING TELFA 4X3 1S ST N-ADH (GAUZE/BANDAGES/DRESSINGS) ×1 IMPLANT
DRSG TEGADERM 2-3/8X2-3/4 SM (GAUZE/BANDAGES/DRESSINGS) ×10 IMPLANT
DRSG TEGADERM 4X4.75 (GAUZE/BANDAGES/DRESSINGS) ×4 IMPLANT
DRSG TELFA 3X8 NADH (GAUZE/BANDAGES/DRESSINGS) ×2 IMPLANT
ELECT REM PT RETURN 9FT ADLT (ELECTROSURGICAL) ×2
ELECTRODE REM PT RTRN 9FT ADLT (ELECTROSURGICAL) ×1 IMPLANT
GAUZE SPONGE 4X4 12PLY STRL (GAUZE/BANDAGES/DRESSINGS) ×1 IMPLANT
GLOVE BIO SURGEON STRL SZ7.5 (GLOVE) ×2 IMPLANT
GLOVE INDICATOR 8.0 STRL GRN (GLOVE) ×2 IMPLANT
GOWN STRL REUS W/ TWL LRG LVL3 (GOWN DISPOSABLE) ×2 IMPLANT
GOWN STRL REUS W/TWL LRG LVL3 (GOWN DISPOSABLE) ×4
GRASPER SUT TROCAR 14GX15 (MISCELLANEOUS) ×1 IMPLANT
KIT RM TURNOVER STRD PROC AR (KITS) ×2 IMPLANT
LABEL OR SOLS (LABEL) ×2 IMPLANT
MESH VENT LT ST 10.2X15.2CM EL (Mesh General) ×1 IMPLANT
NDL HYPO 25X1 1.5 SAFETY (NEEDLE) ×1 IMPLANT
NDL INSUFFLATION 14GA 120MM (NEEDLE) IMPLANT
NDL SAFETY 22GX1.5 (NEEDLE) ×1 IMPLANT
NEEDLE HYPO 25X1 1.5 SAFETY (NEEDLE) IMPLANT
NEEDLE INSUFFLATION 14GA 120MM (NEEDLE) ×2 IMPLANT
NS IRRIG 500ML POUR BTL (IV SOLUTION) ×2 IMPLANT
PACK BASIN MINOR ARMC (MISCELLANEOUS) ×1 IMPLANT
PACK LAP CHOLECYSTECTOMY (MISCELLANEOUS) ×1 IMPLANT
PAD DRESSING TELFA 3X8 NADH (GAUZE/BANDAGES/DRESSINGS) ×1 IMPLANT
POUCH ENDO CATCH 10MM SPEC (MISCELLANEOUS) IMPLANT
SCISSORS METZENBAUM CVD 33 (INSTRUMENTS) ×1 IMPLANT
SEAL FOR SCOPE WARMER C3101 (MISCELLANEOUS) ×1 IMPLANT
SPONGE LAP 18X18 5 PK (GAUZE/BANDAGES/DRESSINGS) ×1 IMPLANT
STAPLER SKIN PROX 35W (STAPLE) ×1 IMPLANT
STRIP CLOSURE SKIN 1/2X4 (GAUZE/BANDAGES/DRESSINGS) ×2 IMPLANT
SUT PDS PLUS 0 (SUTURE) ×3
SUT PDS PLUS AB 0 CT-2 (SUTURE) IMPLANT
SUT SURGILON 0 BLK (SUTURE) ×4 IMPLANT
SUT VIC AB 0 SH 27 (SUTURE) ×2 IMPLANT
SUT VIC AB 2-0 BRD 54 (SUTURE) ×2 IMPLANT
SUT VIC AB 3-0 SH 27 (SUTURE)
SUT VIC AB 3-0 SH 27X BRD (SUTURE) ×1 IMPLANT
SUT VIC AB 4-0 FS2 27 (SUTURE) ×2 IMPLANT
SUT VICRYL+ 3-0 144IN (SUTURE) ×2 IMPLANT
SWABSTK COMLB BENZOIN TINCTURE (MISCELLANEOUS) ×2 IMPLANT
SYR 3ML LL SCALE MARK (SYRINGE) ×1 IMPLANT
SYR CONTROL 10ML (SYRINGE) ×1 IMPLANT
TROCAR XCEL 12X100 BLDLESS (ENDOMECHANICALS) ×1 IMPLANT
TUBING INSUFFLATOR HI FLOW (MISCELLANEOUS) ×2 IMPLANT

## 2015-04-07 NOTE — Anesthesia Procedure Notes (Signed)
Procedure Name: Intubation Date/Time: 04/07/2015 7:41 AM Performed by: Silvana Newness Pre-anesthesia Checklist: Patient identified, Emergency Drugs available, Suction available, Patient being monitored and Timeout performed Patient Re-evaluated:Patient Re-evaluated prior to inductionOxygen Delivery Method: Circle system utilized Preoxygenation: Pre-oxygenation with 100% oxygen Intubation Type: IV induction Ventilation: Mask ventilation without difficulty Laryngoscope Size: Mac and 4 Grade View: Grade I Tube type: Oral Tube size: 7.5 mm Number of attempts: 1 Airway Equipment and Method: Rigid stylet Placement Confirmation: ETT inserted through vocal cords under direct vision,  positive ETCO2 and breath sounds checked- equal and bilateral Secured at: 21 cm Tube secured with: Tape Dental Injury: Teeth and Oropharynx as per pre-operative assessment

## 2015-04-07 NOTE — Anesthesia Preprocedure Evaluation (Addendum)
Anesthesia Evaluation  Patient identified by MRN, date of birth, ID band Patient awake    Reviewed: Allergy & Precautions, NPO status , Patient's Chart, lab work & pertinent test results, reviewed documented beta blocker date and time   Airway Mallampati: II  TM Distance: >3 FB     Dental  (+) Chipped   Pulmonary           Cardiovascular      Neuro/Psych    GI/Hepatic   Endo/Other  diabetes, Well Controlled, Type 2  Renal/GU      Musculoskeletal   Abdominal   Peds  Hematology   Anesthesia Other Findings No cardiac symptoms. Rbbb and LAHB. Healthy pt. With no cardiac problems.  Reproductive/Obstetrics                            Anesthesia Physical Anesthesia Plan  ASA: III  Anesthesia Plan: General   Post-op Pain Management:    Induction: Intravenous  Airway Management Planned: Oral ETT  Additional Equipment:   Intra-op Plan:   Post-operative Plan:   Informed Consent: I have reviewed the patients History and Physical, chart, labs and discussed the procedure including the risks, benefits and alternatives for the proposed anesthesia with the patient or authorized representative who has indicated his/her understanding and acceptance.     Plan Discussed with: CRNA  Anesthesia Plan Comments:         Anesthesia Quick Evaluation

## 2015-04-07 NOTE — Op Note (Signed)
Preoperative diagnosis: Ventral hernia.  Postoperative diagnosis: Same with adhesions.  Operative procedure: Laparoscopy, lysis of adhesions, placement of 10 x 15 cm Ventralight ST mesh.  Surgeon: Ollen Bowl, M.D.  Anesthesia: Gen. endotracheal.  Estimated blood loss: Less than 5 mL.  Clinical note: This 50 year old male underwent laparoscopically assisted sigmoid colectomy approximately 2 years ago for recurrent diverticulitis. Shortly after surgery he noticed a bulge below into the right of the midline incision. This become progressive and uncomfortable. Preoperative CT did not show a definite hernia but clinical exam was suggestive of a defect tracking to the midline. He is brought to the operative for planned laparoscopy and ventral hernia repair. The patient received prior the procedure. Hair was removed with clippers in the preoperative area. SCD stockings were used for DVT prophylaxis.  Operative note: With the patient under adequate general endotracheal anesthesia the abdomen was prepped with ChloraPrep and draped. A varies needle was passed in the left upper quadrant along the midaxillary line. After assuring intra-abdominal location with pain drop test the abdomen was insufflated with CO2 a 10 mmHg pressure. A 5 mm Step port was expanded and inspection showed no evidence of injury from initial port placement. A second port was placed in the right upper quadrant. From this angle was possible to view the extensive adhesions of the small bowel to the midline incision as well as a 3 cm fascial defect at the lower edge of the previous midline incision. A third port was placed in the epigastrium and making use of sharp dissection the adhesive lysis was undertaken. Care was taken to avoid the serosa and in some places a small amount of the peritoneum was taken with the scissors to minimize risk of intestinal injury. At the end of the procedure inspection of the bowel showed no evidence of  violation. Once the defect was cleared and appeared to be about a 3 x 4 cm defect in the midline and the hernia sac itself dissected to the right correlating with his preoperative clinical exam. It was elected to make use of a Ventralight ST mesh with the positioning system. The right upper quadrant 5 mm port was swapped for a 10 mm Step port and then a 12 mm XL port for mesh insertion. A suture passer was passed in the midline of the defect and the balloon catheter grasped and brought up to the skin. The balloon was inflated and the mesh was smoothed across the anterior abdominal wall. Previously placed 0 PDS sutures on 4 corners were then anchored circumferentially. The mesh was then tacked in multiple Concentric circles with the secure strap tacks. The balloon was deflated and the entire delivery system extracted. Inspection showed good hemostasis and good adherence of the mesh on partial desufflation of the abdomen. Attention was turned to the right upper quadrant 12 mm port site. This was closed with with a trans-fascial suture of 0 Vicryl. The abdomen was then desufflated under direct vision. Inspection of the left upper quadrant port site prior to desufflation showed no evidence of injury from initial port placement. Skin incisions were closed with 4-0 Vicryl septic sutures. Benzoin, Steri-Strips, Telfa and Tegaderm dressings were applied.  The patient tolerated the procedure well was taken to the recovery room in stable condition.

## 2015-04-07 NOTE — Anesthesia Postprocedure Evaluation (Signed)
Anesthesia Post Note  Patient: Francisco Porter  Procedure(s) Performed: Procedure(s) (LRB): HERNIA REPAIR VENTRAL ADULT (N/A) INSERTION OF MESH (N/A) LAPAROSCOPY DIAGNOSTIC (N/A)  Patient location during evaluation: PACU Anesthesia Type: General Level of consciousness: awake and alert Pain management: pain level controlled Vital Signs Assessment: post-procedure vital signs reviewed and stable Respiratory status: spontaneous breathing, nonlabored ventilation, respiratory function stable and patient connected to nasal cannula oxygen Cardiovascular status: blood pressure returned to baseline and stable Postop Assessment: no signs of nausea or vomiting Anesthetic complications: no    Last Vitals:  Filed Vitals:   04/07/15 1044 04/07/15 1137  BP: 126/75 122/74  Pulse: 82 70  Temp: 35.6 C   Resp: 18 18    Last Pain:  Filed Vitals:   04/07/15 1138  PainSc: [place]

## 2015-04-07 NOTE — Transfer of Care (Signed)
Immediate Anesthesia Transfer of Care Note  Patient: Francisco Porter  Procedure(s) Performed: Procedure(s): HERNIA REPAIR VENTRAL ADULT (N/A) INSERTION OF MESH (N/A) LAPAROSCOPY DIAGNOSTIC (N/A)  Patient Location: PACU  Anesthesia Type:General  Level of Consciousness: awake, alert , oriented and patient cooperative  Airway & Oxygen Therapy: Patient Spontanous Breathing and Patient connected to face mask oxygen  Post-op Assessment: Report given to RN, Post -op Vital signs reviewed and stable and Patient moving all extremities X 4  Post vital signs: Reviewed and stable  Last Vitals:  Filed Vitals:   04/07/15 0611 04/07/15 0920  BP: 127/88 128/82  Pulse: 86 82  Temp: 35.3 C 37.5 C  Resp: 16 16    Complications: No apparent anesthesia complications

## 2015-04-07 NOTE — H&P (Signed)
No change in clinical history or exam.  Plan: Laparoscopy, hernia repair.

## 2015-04-13 IMAGING — CT CT ABD-PELV W/ CM
2 of 4 series · 15 of 42 positions shown, 19 images · IV contrast (isovue)
Comparison: 07/02/2010

CLINICAL DATA: Left lower abdominal pain and nausea. Elevated white
blood count. Left lower quadrant tenderness.

EXAM:
CT ABDOMEN AND PELVIS WITH CONTRAST
TECHNIQUE: Multidetector CT imaging of the abdomen and pelvis was performed
using the standard protocol following bolus administration of
intravenous contrast.
CONTRAST:  100 cc Isovue 370

[Series 2: routine abd pel with · axial · 0.84mm/px · z∈[-1034,-534]mm · 12 of 112 slices shown, 16 images]
[im 6/112  soft-tissue]
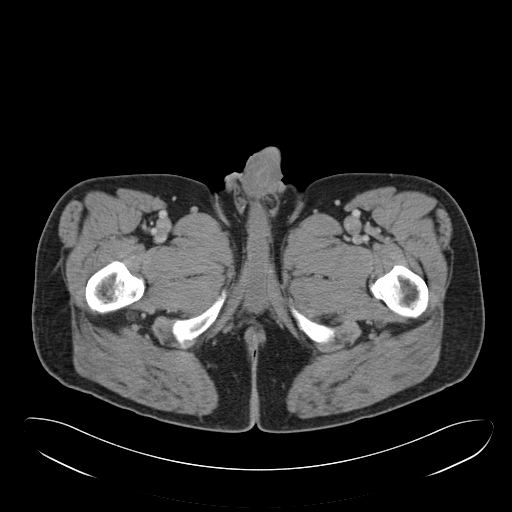
[im 6/112  bone]
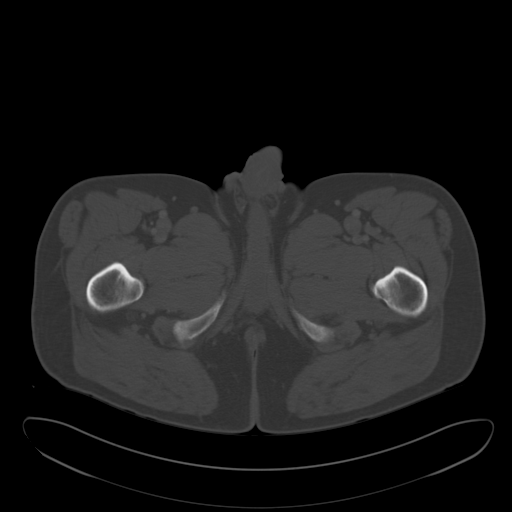
[im 18/112  soft-tissue]
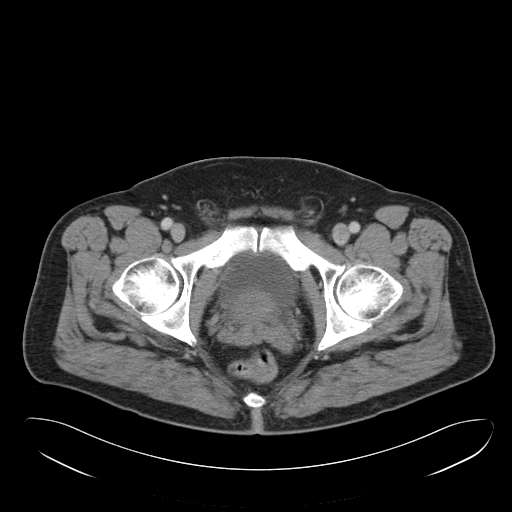
[im 30/112  soft-tissue]
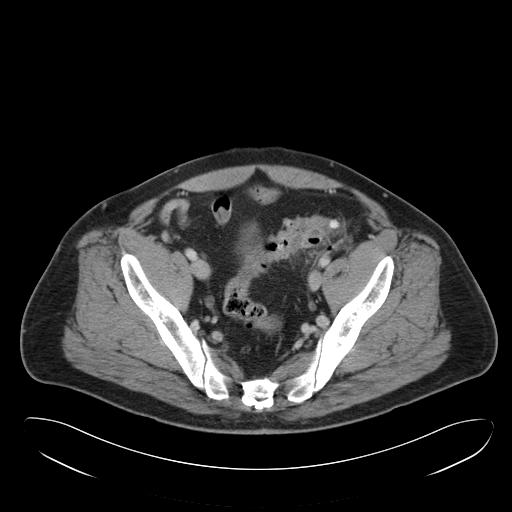
[im 41/112  soft-tissue]
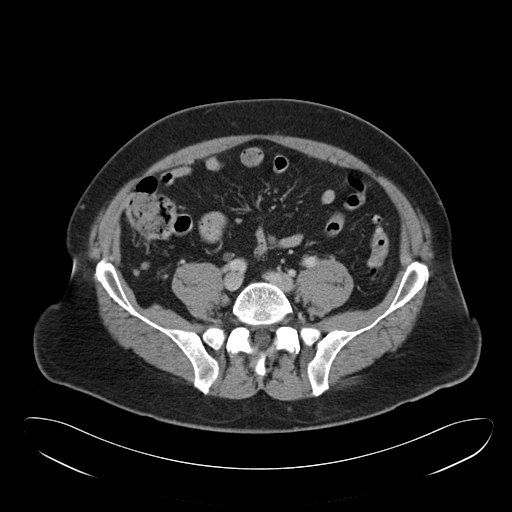
[im 53/112  soft-tissue]
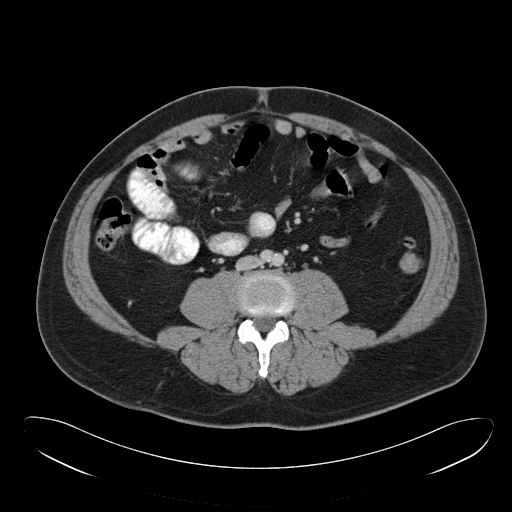
[im 59/112  soft-tissue]
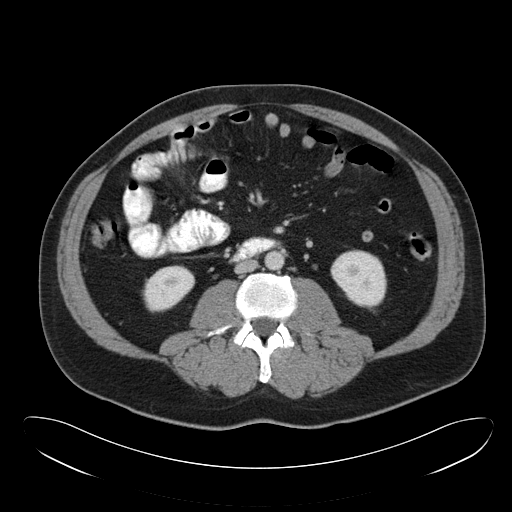
[im 71/112  soft-tissue]
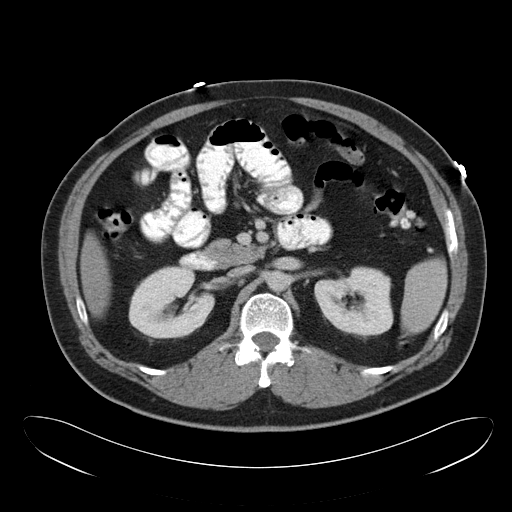
[im 82/112  soft-tissue]
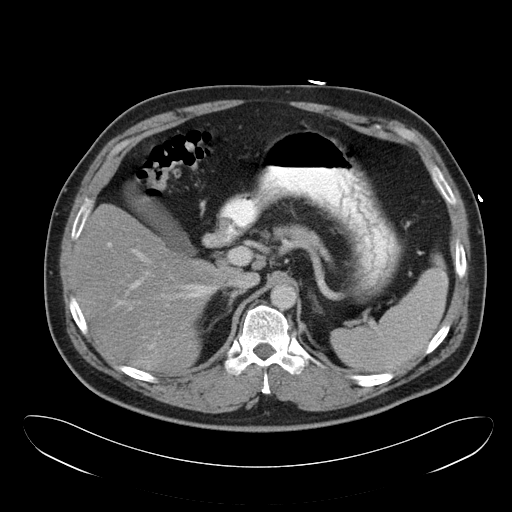
[im 88/112  lung]
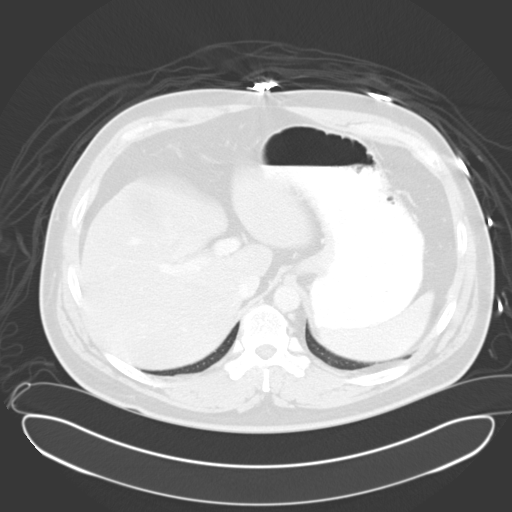
[im 94/112  soft-tissue]
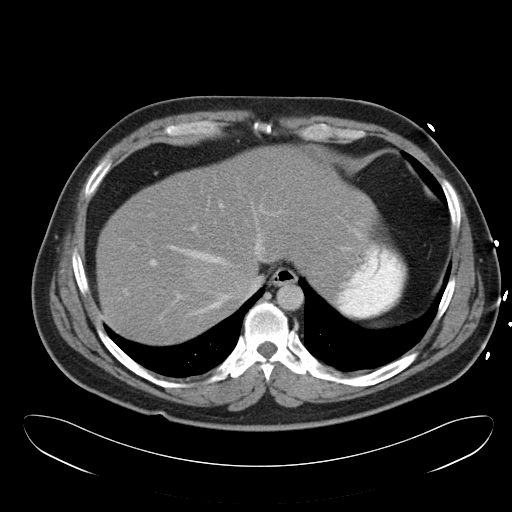
[im 94/112  lung]
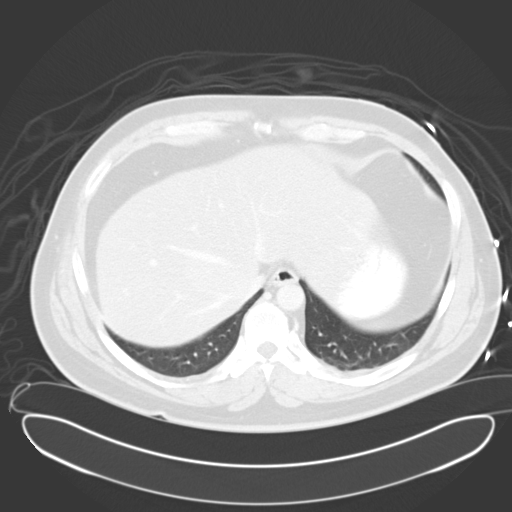
[im 94/112  bone]
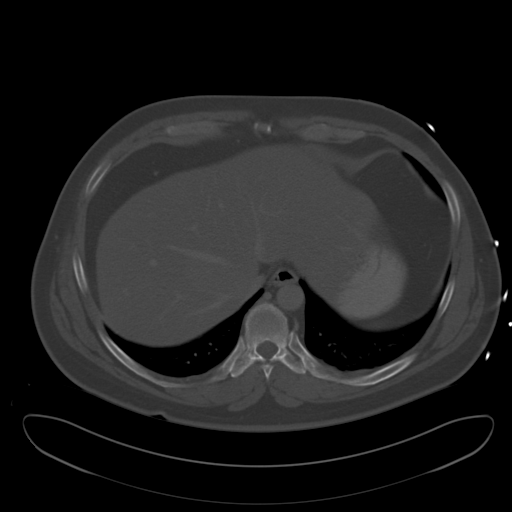
[im 100/112  lung]
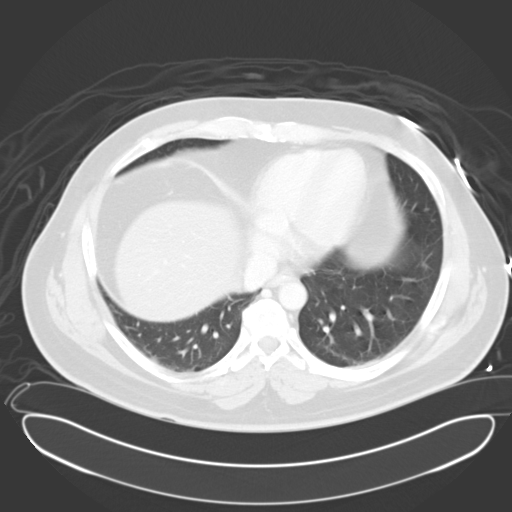
[im 106/112  soft-tissue]
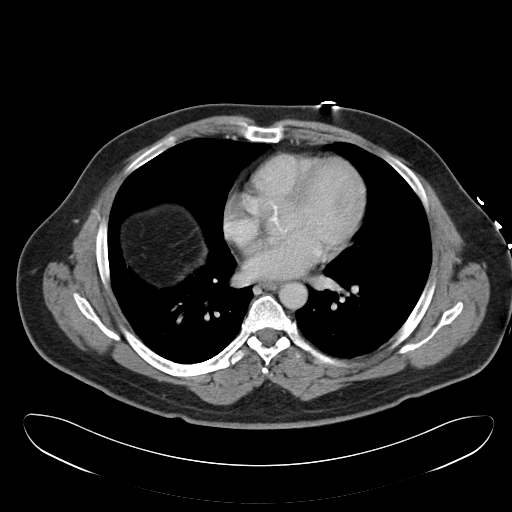
[im 106/112  lung]
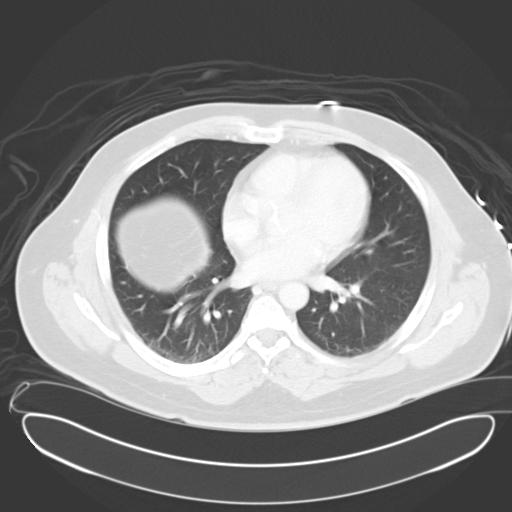

[Series 6: cor routine abd pel with · coronal · 0.86mm/px · 3 of 146 slices shown]
[im 49/146  soft-tissue]
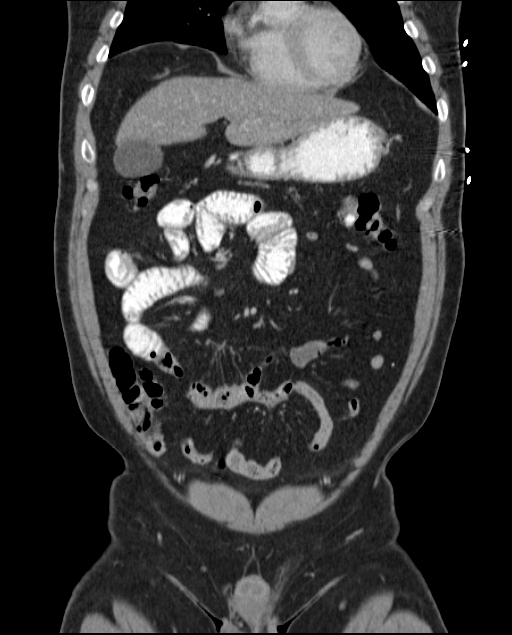
[im 65/146  soft-tissue]
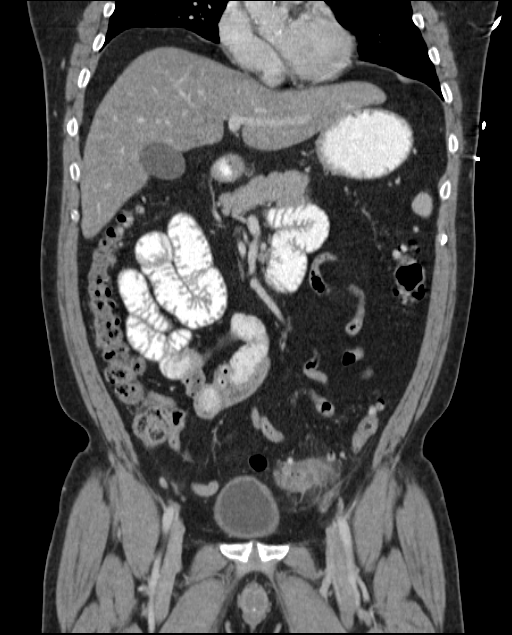
[im 81/146  soft-tissue]
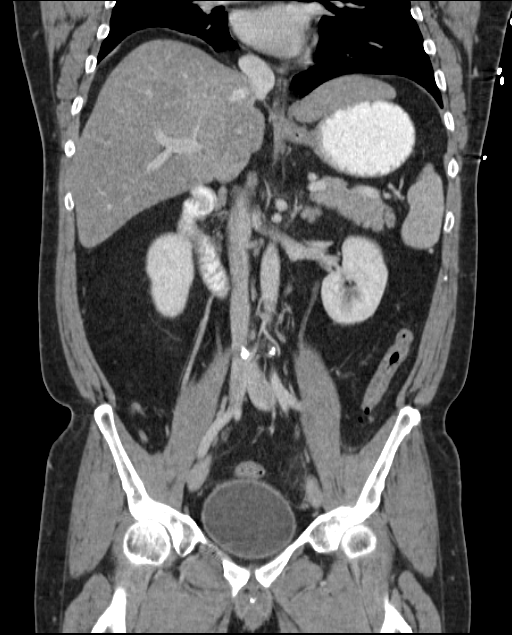

[15 of 42 positions shown; findings below may reference images not displayed]

FINDINGS: There is focal acute diverticulitis of the proximal sigmoid portion
of the colon with a micro perforation. There is a single bubble of
air outside of the lumen of the bowel seen on image 82 of series 2.
There are numerous diverticula throughout the colon but most
numerous in the descending and sigmoid portions. The bowel is
otherwise normal including the terminal ileum and appendix.

There is hepatic steatosis. Biliary tree is normal. Spleen,
pancreas, adrenal glands, and kidneys are normal. No osseous
abnormality. Bladder is normal.
IMPRESSION: Acute proximal sigmoid diverticulitis with micro perforation. No
abscess.

## 2015-04-18 ENCOUNTER — Ambulatory Visit (INDEPENDENT_AMBULATORY_CARE_PROVIDER_SITE_OTHER): Payer: 59 | Admitting: General Surgery

## 2015-04-18 ENCOUNTER — Encounter: Payer: Self-pay | Admitting: General Surgery

## 2015-04-18 VITALS — BP 142/78 | HR 62 | Resp 12 | Ht 68.0 in | Wt 191.0 lb

## 2015-04-18 DIAGNOSIS — K439 Ventral hernia without obstruction or gangrene: Secondary | ICD-10-CM

## 2015-04-18 NOTE — Patient Instructions (Signed)
Patient to return in one month. 

## 2015-04-18 NOTE — Progress Notes (Signed)
Patient ID: Francisco Porter, male   DOB: [birth year], 50 y.o.   MRN: [id number]  Chief Complaint  Patient presents with  . Routine Post Op    ventral hernia    HPI Francisco Porter is a 50 y.o. male here today for his post op ventral hernia repair done on 04/07/15. Patient states he is doing well. It took a little time for his bowels to resume, but once this occurred on postop day 3 he is had minimal discomfort. Only using Tylenol for the last few days. He returned to work yesterday.  I personally reviewed the patient's history. HPI  Past Medical History  Diagnosis Date  . Hyperlipidemia   . Family history of anesthesia complication     his mother has heart mur mur  . Diverticulitis   . Diabetes mellitus without complication ([place])     [id number]    Past Surgical History  Procedure Laterality Date  . R shoulder surgery    . Laparoscopic sigmoid colectomy N/A 11/29/2013    Dr Thompson/Procedure: LAPAROSCOPIC ASSISTED SIGMOID COLECTOMY;  Surgeon: Georganna Skeans, MD;  Location: [facility];  Service: General;  Laterality: N/A;  . Colonoscopy  2014  . Ventral hernia repair N/A 04/07/2015    Procedure: HERNIA REPAIR VENTRAL ADULT;  Surgeon: Robert Bellow, MD;  Location: [facility];  Service: General;  Laterality: N/A;  . Insertion of mesh N/A 04/07/2015    Procedure: INSERTION OF MESH;  Surgeon: Robert Bellow, MD;  Location: [facility];  Service: General;  Laterality: N/A;  . Laparoscopy N/A 04/07/2015    Procedure: LAPAROSCOPY DIAGNOSTIC;  Surgeon: Robert Bellow, MD;  Location: [facility];  Service: General;  Laterality: N/A;  . Hernia repair  04/07/2015    Laparoscopic placement of a 10 x 15 ventral light mesh    Family History  Problem Relation Age of Onset  . Bowel Disease Father   . Diabetes Mother   . Diabetes Father     Social History Social History  Substance Use Topics  . Smoking status: Never Smoker   . Smokeless tobacco: Current User    Types: Chew  . Alcohol Use: Yes   Comment: 4 beers /week    Allergies  Allergen Reactions  . Codeine Hives and Rash    Current Outpatient Prescriptions  Medication Sig Dispense Refill  . CRESTOR 10 MG tablet Take 10 mg by mouth daily.     . metFORMIN (GLUCOPHAGE) 500 MG tablet Take 500 mg by mouth 2 (two) times daily with a meal.     No current facility-administered medications for this visit.    Review of Systems Review of Systems  Constitutional: Negative.   Respiratory: Negative.   Cardiovascular: Negative.     Blood pressure 142/78, pulse 62, resp. rate 12, height 5\' 8"  (1.727 m), weight 191 lb (86.637 kg).  Physical Exam Physical Exam  Constitutional: He appears well-developed and well-nourished.  Eyes: Conjunctivae are normal. No scleral icterus.  Neck: Neck supple.  Abdominal: Soft. Normal appearance and bowel sounds are normal. There is no tenderness.    Ventral hernia repair is clean and healing well.   Lymphadenopathy:    He has no cervical adenopathy.  Skin: Skin is dry.      Assessment    Doing well status post laparoscopic placement of intraperitoneal mesh for lower abdominal hernia.    Plan    The patient may resume walking as desired. He may start workouts with cardio equipment, using one of  her or lower extremity at a time.    Patient to return in one month. PCP:  [facility]\ This information has been scribed by Gaspar Cola CMA.   Robert Bellow 04/18/2015, 2:37 PM

## 2015-05-09 ENCOUNTER — Encounter: Payer: Self-pay | Admitting: General Surgery

## 2015-05-22 ENCOUNTER — Ambulatory Visit: Payer: 59 | Admitting: General Surgery

## 2015-07-05 ENCOUNTER — Encounter: Payer: Self-pay | Admitting: *Deleted

## 2016-11-28 ENCOUNTER — Encounter: Payer: Self-pay | Admitting: *Deleted

## 2016-11-29 ENCOUNTER — Encounter: Payer: Self-pay | Admitting: *Deleted

## 2016-11-29 ENCOUNTER — Ambulatory Visit: Payer: 59 | Admitting: Anesthesiology

## 2016-11-29 ENCOUNTER — Encounter
Admission: RE | Disposition: A | Discharge status: Home / Self Care | Payer: Self-pay | Source: Ambulatory Visit | Attending: Gastroenterology

## 2016-11-29 ENCOUNTER — Ambulatory Visit
Admission: RE | Admit: 2016-11-29 | Discharge: 2016-11-29 | Disposition: A | Discharge status: Home / Self Care | Payer: 59 | Source: Ambulatory Visit | Attending: Gastroenterology | Admitting: Gastroenterology

## 2016-11-29 DIAGNOSIS — D124 Benign neoplasm of descending colon: Secondary | ICD-10-CM | POA: Insufficient documentation

## 2016-11-29 DIAGNOSIS — Z885 Allergy status to narcotic agent status: Secondary | ICD-10-CM | POA: Diagnosis not present

## 2016-11-29 DIAGNOSIS — K635 Polyp of colon: Secondary | ICD-10-CM | POA: Diagnosis not present

## 2016-11-29 DIAGNOSIS — K621 Rectal polyp: Secondary | ICD-10-CM | POA: Diagnosis not present

## 2016-11-29 DIAGNOSIS — E119 Type 2 diabetes mellitus without complications: Secondary | ICD-10-CM | POA: Diagnosis not present

## 2016-11-29 DIAGNOSIS — K5730 Diverticulosis of large intestine without perforation or abscess without bleeding: Secondary | ICD-10-CM | POA: Diagnosis not present

## 2016-11-29 DIAGNOSIS — Z79899 Other long term (current) drug therapy: Secondary | ICD-10-CM | POA: Diagnosis not present

## 2016-11-29 DIAGNOSIS — I10 Essential (primary) hypertension: Secondary | ICD-10-CM | POA: Diagnosis not present

## 2016-11-29 DIAGNOSIS — Z86010 Personal history of colonic polyps: Secondary | ICD-10-CM | POA: Diagnosis not present

## 2016-11-29 DIAGNOSIS — Z980 Intestinal bypass and anastomosis status: Secondary | ICD-10-CM | POA: Insufficient documentation

## 2016-11-29 DIAGNOSIS — Z7984 Long term (current) use of oral hypoglycemic drugs: Secondary | ICD-10-CM | POA: Insufficient documentation

## 2016-11-29 DIAGNOSIS — Z9049 Acquired absence of other specified parts of digestive tract: Secondary | ICD-10-CM | POA: Diagnosis not present

## 2016-11-29 DIAGNOSIS — E785 Hyperlipidemia, unspecified: Secondary | ICD-10-CM | POA: Diagnosis not present

## 2016-11-29 DIAGNOSIS — Z1211 Encounter for screening for malignant neoplasm of colon: Secondary | ICD-10-CM | POA: Insufficient documentation

## 2016-11-29 HISTORY — DX: Essential (primary) hypertension: I10

## 2016-11-29 HISTORY — PX: COLONOSCOPY WITH PROPOFOL: SHX5780

## 2016-11-29 LAB — GLUCOSE, CAPILLARY: Glucose-Capillary: 117 mg/dL — ABNORMAL HIGH (ref 65–99)

## 2016-11-29 SURGERY — COLONOSCOPY WITH PROPOFOL
Anesthesia: General

## 2016-11-29 MED ORDER — SODIUM CHLORIDE 0.9 % IV SOLN
INTRAVENOUS | Status: DC
  Administered 2016-11-29 (×2): via INTRAVENOUS

## 2016-11-29 MED ORDER — PROPOFOL 10 MG/ML IV BOLUS
INTRAVENOUS | Status: AC
  Filled 2016-11-29: qty 20

## 2016-11-29 MED ORDER — PROPOFOL 500 MG/50ML IV EMUL
INTRAVENOUS | Status: DC | PRN
  Administered 2016-11-29: 150 ug/kg/min via INTRAVENOUS

## 2016-11-29 MED ORDER — PROPOFOL 500 MG/50ML IV EMUL
INTRAVENOUS | Status: AC
  Filled 2016-11-29: qty 50

## 2016-11-29 MED ORDER — PROPOFOL 10 MG/ML IV BOLUS
INTRAVENOUS | Status: DC | PRN
  Administered 2016-11-29: 70 mg via INTRAVENOUS

## 2016-11-29 MED ORDER — SODIUM CHLORIDE 0.9 % IV SOLN: INTRAVENOUS | Status: DC

## 2016-11-29 NOTE — H&P (Signed)
Outpatient short stay form Pre-procedure 11/29/2016 7:35 AM Lollie Sails MD  Primary Physician: Dr. Maryland Pink  Reason for visit: Colonoscopy  History of present illness:  Patient is a 51 year old male presenting today as above. He has a personal history of adenomatous colon polyps with his last colonoscopy being in July 2014. He tolerated his prep well. He takes no aspirin or blood thinning agents.  He has a history of a laparoscopic sigmoid colectomy 11/2013 for diverticulitis. Recovered well from that although he did have to have a hernia repair about 2 years later.    Current Facility-Administered Medications:  .  0.9 %  sodium chloride infusion, , Intravenous, Continuous, Manya Silvas, MD, Last Rate: 20 mL/hr at 11/29/16 0728 .  0.9 %  sodium chloride infusion, , Intravenous, Continuous, Lollie Sails, MD  Prescriptions Prior to Admission  Medication Sig Dispense Refill Last Dose  . CRESTOR 10 MG tablet Take 10 mg by mouth daily.    Past Week at Unknown time  . losartan (COZAAR) 25 MG tablet Take 25 mg by mouth daily.   Past Week at Unknown time  . metFORMIN (GLUCOPHAGE) 500 MG tablet Take 500 mg by mouth 2 (two) times daily with a meal.   Past Week at Unknown time  . polyethylene glycol-electrolytes (NULYTELY/GOLYTELY) 420 g solution Take 4,000 mLs by mouth once.     . psyllium (METAMUCIL) 58.6 % packet Take 1 packet by mouth daily.        Allergies  Allergen Reactions  . Codeine Hives and Rash     Past Medical History:  Diagnosis Date  . Diabetes mellitus without complication ([place])    7209  . Diverticulitis   . Family history of anesthesia complication    his mother has heart mur mur  . Hyperlipidemia   . Hypertension     Review of systems:      Physical Exam    Heart and lungs: Regular rate and rhythm without rub or gallop, lungs are bilaterally clear.    HEENT: Normocephalic atraumatic eyes are anicteric    Other:     Pertinant exam  for procedure: Soft nontender nondistended bowel sounds positive normoactive.    Planned proceedures: Colonoscopy and indicated procedures. I have discussed the risks benefits and complications of procedures to include not limited to bleeding, infection, perforation and the risk of sedation and the patient wishes to proceed.    Lollie Sails, MD Gastroenterology 11/29/2016  7:35 AM

## 2016-11-29 NOTE — Anesthesia Procedure Notes (Signed)
Date/Time: 11/29/2016 7:44 AM Performed by: Nelda Marseille Pre-anesthesia Checklist: Patient identified, Emergency Drugs available, Suction available, Patient being monitored and Timeout performed Oxygen Delivery Method: Nasal cannula

## 2016-11-29 NOTE — Anesthesia Post-op Follow-up Note (Signed)
Anesthesia QCDR form completed.        

## 2016-11-29 NOTE — Transfer of Care (Signed)
Immediate Anesthesia Transfer of Care Note  Patient: Francisco Porter  Procedure(s) Performed: COLONOSCOPY WITH PROPOFOL (N/A )  Patient Location: PACU  Anesthesia Type:General  Level of Consciousness: sedated  Airway & Oxygen Therapy: Patient Spontanous Breathing and Patient connected to nasal cannula oxygen  Post-op Assessment: Report given to RN and Post -op Vital signs reviewed and stable  Post vital signs: Reviewed and stable  Last Vitals:  Vitals:   11/29/16 0835 11/29/16 0845  BP: (!) 101/59 106/70  Pulse: 71 65  Resp: 16 15  Temp: (!) 36.1 C   SpO2: 98% 98%    Last Pain:  Vitals:   11/29/16 0835  TempSrc: Tympanic         Complications: No apparent anesthesia complications

## 2016-11-29 NOTE — Anesthesia Preprocedure Evaluation (Signed)
Anesthesia Evaluation  Patient identified by MRN, date of birth, ID band Patient awake    Reviewed: Allergy & Precautions, H&P , NPO status , Patient's Chart, lab work & pertinent test results, reviewed documented beta blocker date and time   Airway Mallampati: II   Neck ROM: full    Dental  (+) Poor Dentition   Pulmonary neg pulmonary ROS,    Pulmonary exam normal        Cardiovascular hypertension, negative cardio ROS Normal cardiovascular exam Rhythm:regular Rate:Normal     Neuro/Psych negative neurological ROS  negative psych ROS   GI/Hepatic negative GI ROS, Neg liver ROS,   Endo/Other  negative endocrine ROSdiabetes  Renal/GU negative Renal ROS  negative genitourinary   Musculoskeletal   Abdominal   Peds  Hematology negative hematology ROS (+)   Anesthesia Other Findings Past Medical History: No date: Diabetes mellitus without complication ([place])     Comment:  2002 No date: Diverticulitis No date: Family history of anesthesia complication     Comment:  his mother has heart mur mur No date: Hyperlipidemia No date: Hypertension Past Surgical History: 11/2013: COLON SURGERY     Comment:  PARTIAL COLECTOMY FOR DIVERTICULITIS 2014: COLONOSCOPY 04/07/2015: HERNIA REPAIR     Comment:  Laparoscopic placement of a 10 x 15 ventral light mesh 04/07/2015: INSERTION OF MESH; N/A     Comment:  Procedure: INSERTION OF MESH;  Surgeon: Robert Bellow, MD;  Location: [facility];  Service: General;                Laterality: N/A; No date: JOINT REPLACEMENT 11/29/2013: LAPAROSCOPIC SIGMOID COLECTOMY; N/A     Comment:  Dr Thompson/Procedure: LAPAROSCOPIC ASSISTED SIGMOID               COLECTOMY;  Surgeon: Georganna Skeans, MD;  Location: [facility];  Service: General;  Laterality: N/A; 04/07/2015: LAPAROSCOPY; N/A     Comment:  Procedure: LAPAROSCOPY DIAGNOSTIC;  Surgeon: Robert Bellow, MD;  Location: [facility];  Service: General;                Laterality: N/A; No date: R shoulder surgery 04/07/2015: VENTRAL HERNIA REPAIR; N/A     Comment:  Procedure: HERNIA REPAIR VENTRAL ADULT;  Surgeon:               Robert Bellow, MD;  Location: [facility];  Service:               General;  Laterality: N/A; BMI    Body Mass Index:  30.41 kg/m     Reproductive/Obstetrics negative OB ROS                             Anesthesia Physical Anesthesia Plan  ASA: III  Anesthesia Plan: General   Post-op Pain Management:    Induction:   PONV Risk Score and Plan:   Airway Management Planned:   Additional Equipment:   Intra-op Plan:   Post-operative Plan:   Informed Consent: I have reviewed the patients History and Physical, chart, labs and discussed the procedure including the risks, benefits and alternatives for the proposed anesthesia with the patient or authorized representative who has indicated his/her understanding and acceptance.   Dental Advisory Given  Plan Discussed with: CRNA  Anesthesia Plan Comments:         Anesthesia Quick Evaluation

## 2016-11-29 NOTE — Op Note (Signed)
[facility] Gastroenterology Patient Name: Francisco Porter Procedure Date: 11/29/2016 7:48 AM MRN: [id number] Account #: [id number] Date of Birth: 09-Jul-1965 Admit Type: Outpatient Age: 51 Room: [facility] ENDO ROOM 1 Gender: Male Note Status: Finalized Procedure:            Colonoscopy Indications:          Personal history of colonic polyps Providers:            Lollie Sails, MD Referring MD:         Irven Easterly. Kary Kos, MD (Referring MD) Medicines:            Monitored Anesthesia Care Complications:        No immediate complications. Procedure:            Pre-Anesthesia Assessment:                       - ASA Grade Assessment: III - A patient with severe                        systemic disease.                       After obtaining informed consent, the colonoscope was                        passed under direct vision. Throughout the procedure,                        the patient's blood pressure, pulse, and oxygen                        saturations were monitored continuously. The                        Colonoscope was introduced through the anus and                        advanced to the the cecum, identified by appendiceal                        orifice and ileocecal valve. The colonoscopy was                        performed with moderate difficulty due to poor bowel                        prep. Successful completion of the procedure was aided                        by lavage. The patient tolerated the procedure well.                        The quality of the bowel preparation was fair. Findings:      Multiple small and large-mouthed diverticula were found in the sigmoid       colon, descending colon, transverse colon and ascending colon.      There was evidence of a prior functional end-to-end colo-colonic       anastomosis at 25 cm proximal to the anus. This was patent and was       characterized by healthy appearing  mucosa and an intact staple line.      A 5 mm  polyp was found in the descending colon. The polyp was sessile.       The polyp was removed with a cold snare. Resection and retrieval were       complete.      A 2 mm polyp was found in the distal sigmoid colon. The polyp was       sessile. The polyp was removed with a cold biopsy forceps. Resection and       retrieval were complete.      Four sessile polyps were found in the rectum. The polyps were 1 to 2 mm       in size. These polyps were removed with a cold biopsy forceps. Resection       and retrieval were complete.      One 8 mm mucosal nodule was found at 15 cm proximal to the anus.       Biopsies were taken with a cold forceps for histology.      The retroflexed view of the distal rectum and anal verge was normal and       showed no anal or rectal abnormalities.      The digital rectal exam was normal. Impression:           - Preparation of the colon was fair.                       - Diverticulosis in the sigmoid colon, in the                        descending colon, in the transverse colon and in the                        ascending colon.                       - Patent functional end-to-end colo-colonic                        anastomosis, characterized by healthy appearing mucosa                        and an intact staple line.                       - One 5 mm polyp in the descending colon, removed with                        a cold snare. Resected and retrieved.                       - One 2 mm polyp in the distal sigmoid colon, removed                        with a cold biopsy forceps. Resected and retrieved.                       - Four 1 to 2 mm polyps in the rectum, removed with a                        cold biopsy forceps. Resected and retrieved.                       -  Mucosal nodule at 15 cm proximal to the anus.                        Biopsied.                       - The distal rectum and anal verge are normal on                        retroflexion  view. Recommendation:       - Await pathology results.                       - Telephone GI clinic for pathology results in 1 week. Procedure Code(s):    --- Professional ---                       [phone], Colonoscopy, flexible; with removal of tumor(s),                        polyp(s), or other lesion(s) by snare technique                       45380, 71, Colonoscopy, flexible; with biopsy, single                        or multiple Diagnosis Code(s):    --- Professional ---                       Z98.0, Intestinal bypass and anastomosis status                       D12.4, Benign neoplasm of descending colon                       D12.5, Benign neoplasm of sigmoid colon                       K62.1, Rectal polyp                       K63.89, Other specified diseases of intestine                       Z86.010, Personal history of colonic polyps                       K57.30, Diverticulosis of large intestine without                        perforation or abscess without bleeding CPT copyright 2016 [employer]. All rights reserved. The codes documented in this report are preliminary and upon coder review may  be revised to meet current compliance requirements. Lollie Sails, MD 11/29/2016 8:35:43 AM This report has been signed electronically. Number of Addenda: 0 Note Initiated On: 11/29/2016 7:48 AM Scope Withdrawal Time: 0 hours 27 minutes 6 seconds  Total Procedure Duration: 0 hours 37 minutes 17 seconds       [facility]

## 2016-12-02 ENCOUNTER — Encounter: Payer: Self-pay | Admitting: Gastroenterology

## 2016-12-03 LAB — SURGICAL PATHOLOGY

## 2016-12-16 NOTE — Anesthesia Postprocedure Evaluation (Signed)
Anesthesia Post Note  Patient: Francisco Porter  Procedure(s) Performed: COLONOSCOPY WITH PROPOFOL (N/A )  Patient location during evaluation: PACU Anesthesia Type: General Level of consciousness: awake and alert Pain management: pain level controlled Vital Signs Assessment: post-procedure vital signs reviewed and stable Respiratory status: spontaneous breathing, nonlabored ventilation, respiratory function stable and patient connected to nasal cannula oxygen Cardiovascular status: blood pressure returned to baseline and stable Postop Assessment: no apparent nausea or vomiting Anesthetic complications: no     Last Vitals:  Vitals:   11/29/16 0845 11/29/16 0855  BP: 106/70 111/79  Pulse: 65 62  Resp: 15 11  Temp:    SpO2: 98% 100%    Last Pain:  Vitals:   11/30/16 1438  TempSrc:   PainSc: 0-No pain                 Molli Barrows

## 2017-03-08 IMAGING — CT CT ABD-PELV W/ CM
1 of 3 series · 14 of 32 positions shown, 19 images · IV contrast (omnipaque)
Comparison: 11/05/2013

CLINICAL DATA: Ventral hernia without obstruction

EXAM:
CT ABDOMEN AND PELVIS WITH CONTRAST
TECHNIQUE: Multidetector CT imaging of the abdomen and pelvis was performed
using the standard protocol following bolus administration of
intravenous contrast.
CONTRAST:  100mL OMNIPAQUE IOHEXOL 350 MG/ML SOLN

[Series 2: axial st · axial · 0.78mm/px · z∈[-972,-492]mm · 14 of 108 slices shown, 19 images]
[im 6/108  soft-tissue]
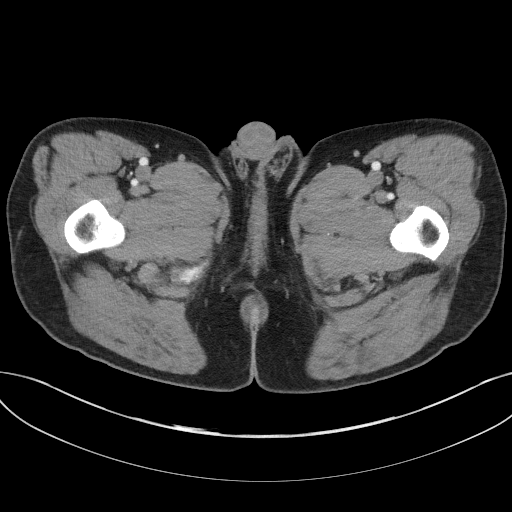
[im 6/108  bone]
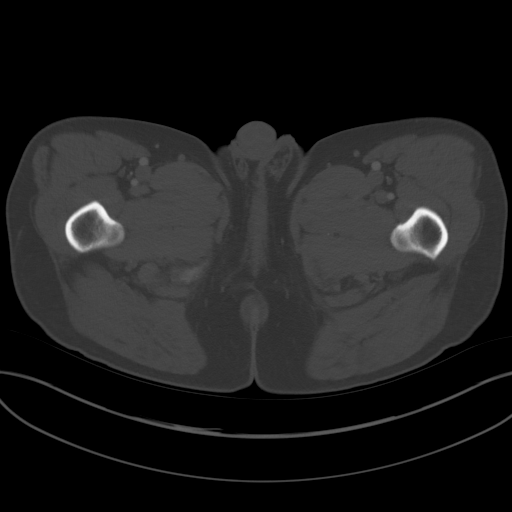
[im 17/108  soft-tissue]
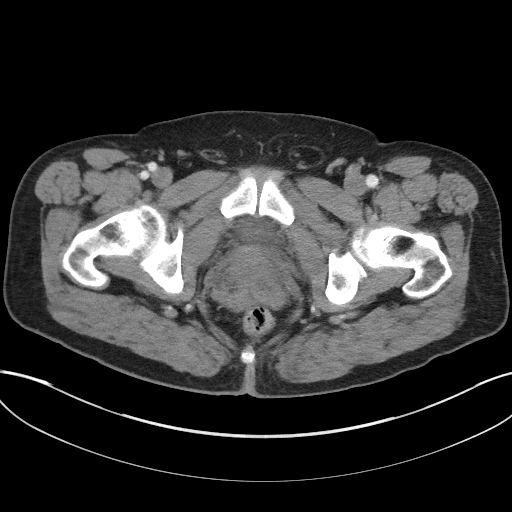
[im 22/108  soft-tissue]
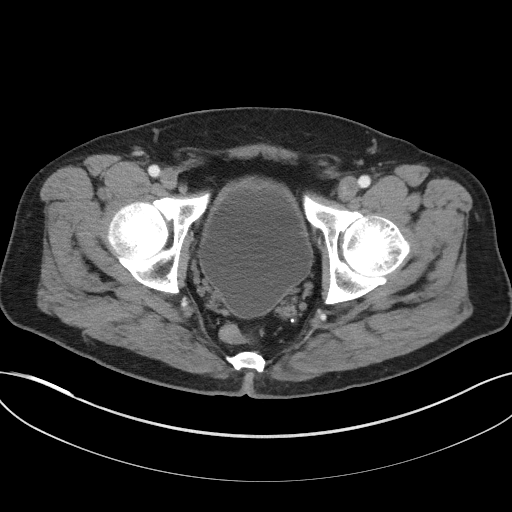
[im 33/108  soft-tissue]
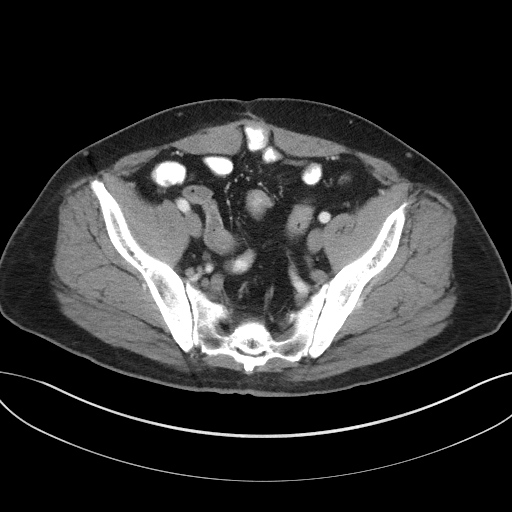
[im 38/108  soft-tissue]
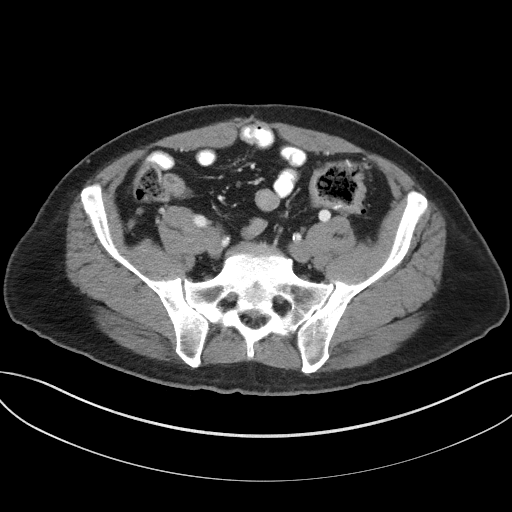
[im 49/108  soft-tissue]
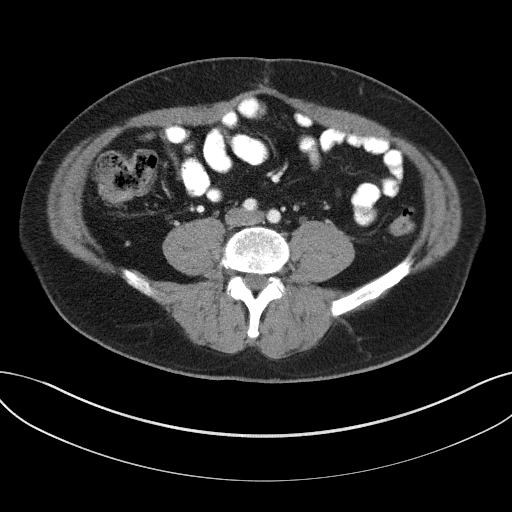
[im 54/108  soft-tissue]
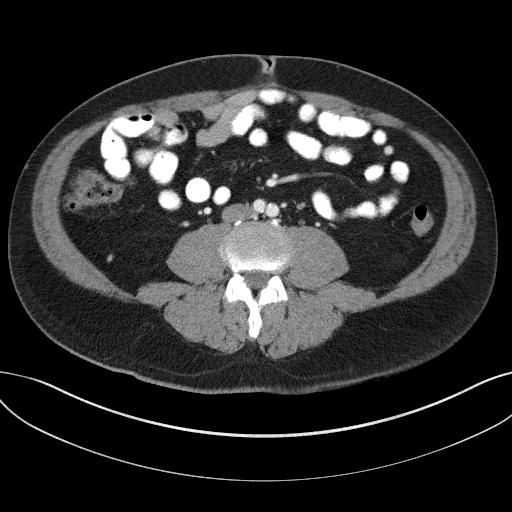
[im 59/108  soft-tissue]
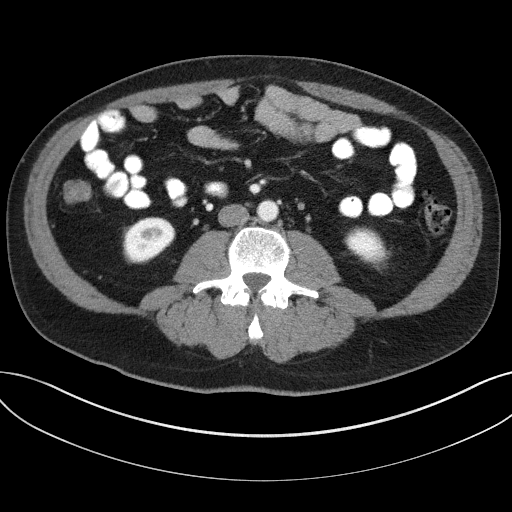
[im 70/108  soft-tissue]
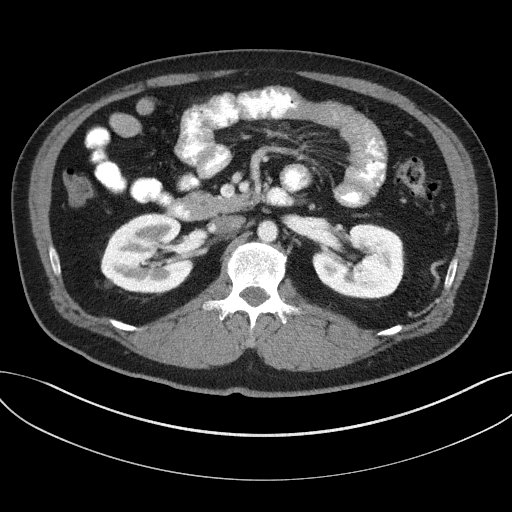
[im 70/108  bone]
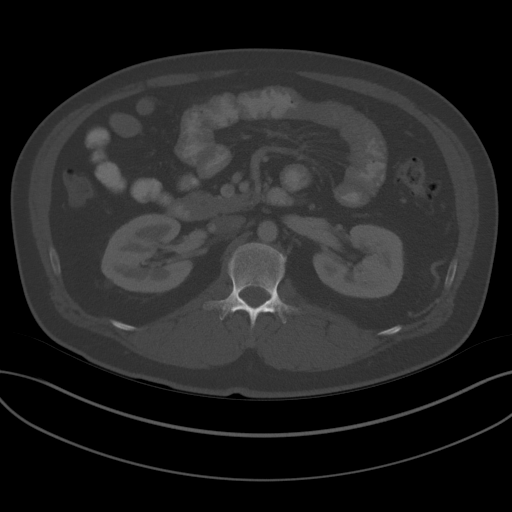
[im 75/108  soft-tissue]
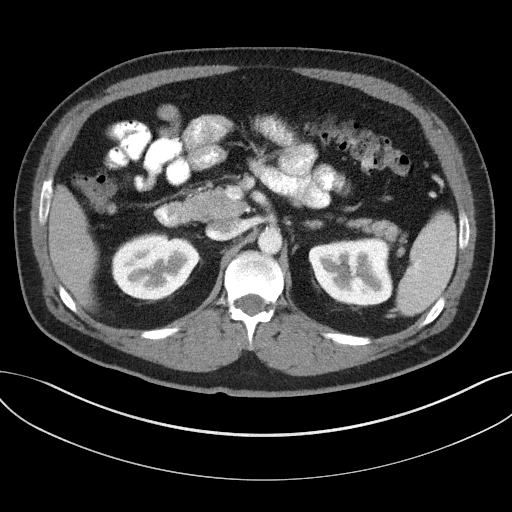
[im 86/108  soft-tissue]
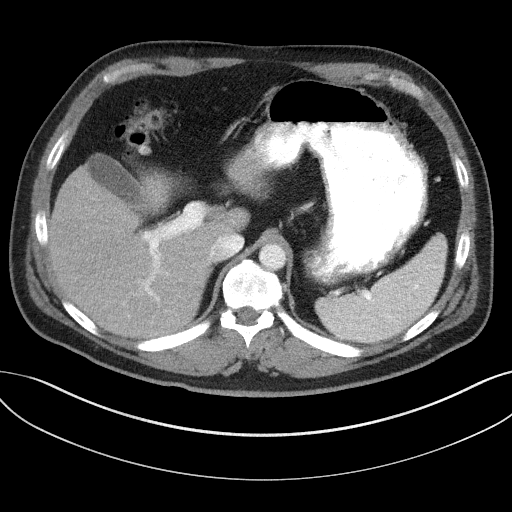
[im 86/108  lung]
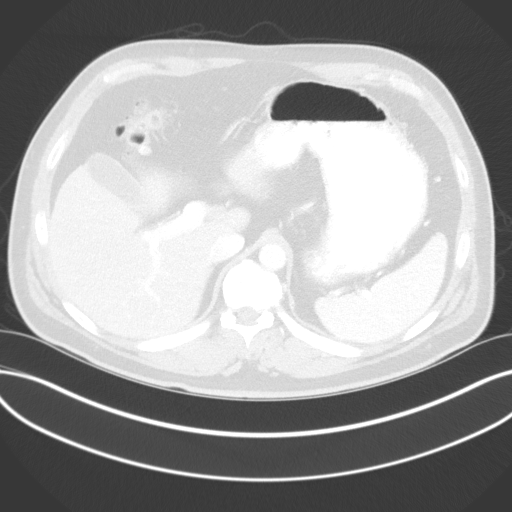
[im 91/108  soft-tissue]
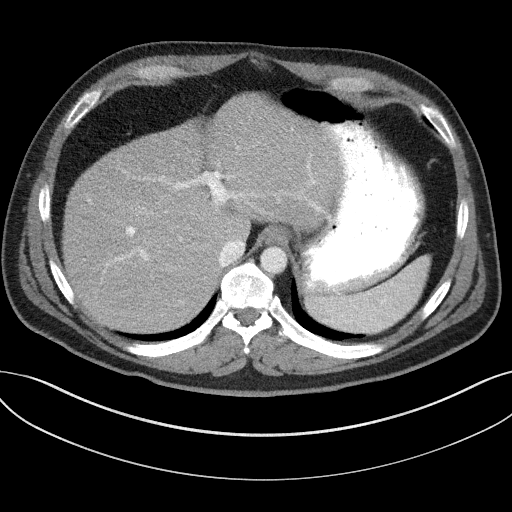
[im 91/108  lung]
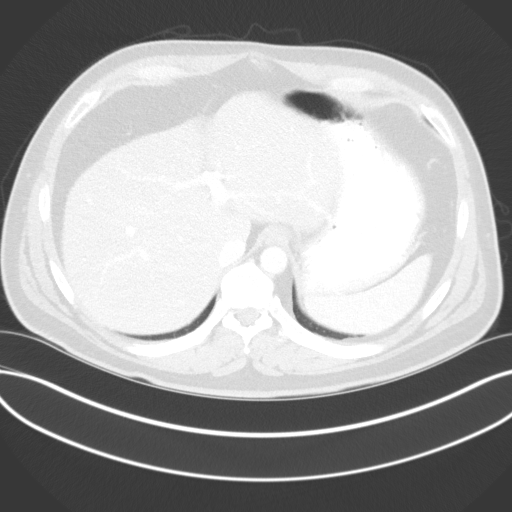
[im 97/108  lung]
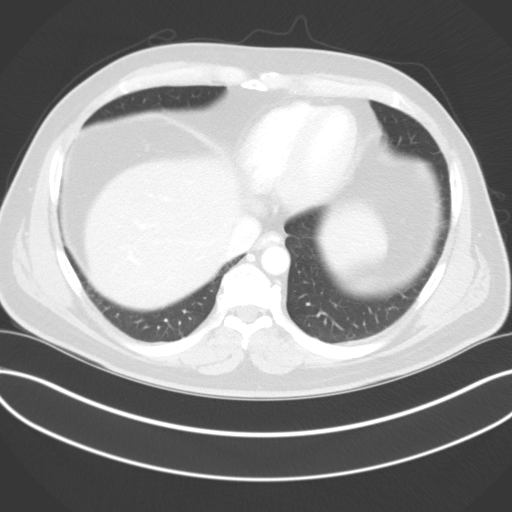
[im 102/108  soft-tissue]
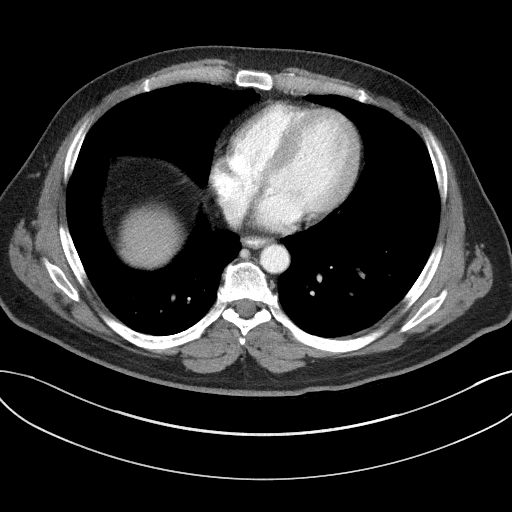
[im 102/108  lung]
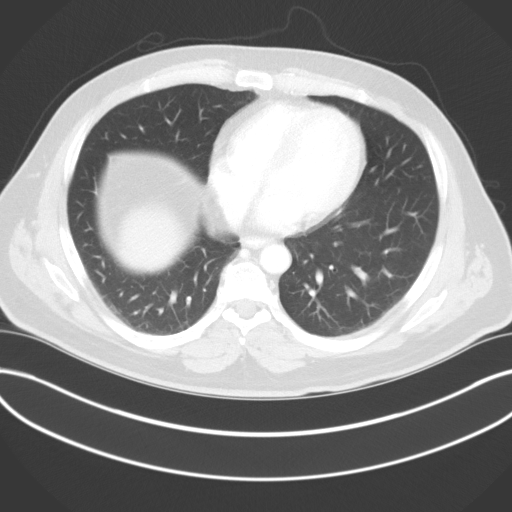

[14 of 32 positions shown; findings below may reference images not displayed]

FINDINGS: Lung bases are clear.  No effusions.  Heart is normal size.

Diffuse fatty infiltration of the liver. No focal abnormality.
Gallbladder, spleen, pancreas, adrenals and kidneys are normal.

Colonic diverticulosis. Postoperative changes in the left lower
quadrant. No evidence of bowel obstruction. Appendix is normal.
Stomach and small bowel are decompressed. No free fluid, free air or
adenopathy. Urinary bladder is unremarkable. Aorta is normal
caliber.

Small umbilical hernia containing fat.

No acute bony abnormality or focal bone lesion.
IMPRESSION: Mild diffuse fatty infiltration of the liver.

Postoperative changes within the proximal sigmoid colon in the left
lower quadrant.

Colonic diverticulosis.  No active diverticulitis.

Very small umbilical hernia containing fat.

## 2017-07-03 DIAGNOSIS — E1165 Type 2 diabetes mellitus with hyperglycemia: Secondary | ICD-10-CM | POA: Diagnosis not present

## 2017-07-03 DIAGNOSIS — E1169 Type 2 diabetes mellitus with other specified complication: Secondary | ICD-10-CM | POA: Diagnosis not present

## 2017-07-03 DIAGNOSIS — E119 Type 2 diabetes mellitus without complications: Secondary | ICD-10-CM | POA: Diagnosis not present

## 2017-07-03 DIAGNOSIS — E785 Hyperlipidemia, unspecified: Secondary | ICD-10-CM | POA: Diagnosis not present

## 2017-10-08 DIAGNOSIS — E785 Hyperlipidemia, unspecified: Secondary | ICD-10-CM | POA: Diagnosis not present

## 2017-10-08 DIAGNOSIS — E1169 Type 2 diabetes mellitus with other specified complication: Secondary | ICD-10-CM | POA: Diagnosis not present

## 2017-10-08 DIAGNOSIS — E1165 Type 2 diabetes mellitus with hyperglycemia: Secondary | ICD-10-CM | POA: Diagnosis not present

## 2017-11-25 DIAGNOSIS — I10 Essential (primary) hypertension: Secondary | ICD-10-CM | POA: Diagnosis not present

## 2017-11-25 DIAGNOSIS — E785 Hyperlipidemia, unspecified: Secondary | ICD-10-CM | POA: Diagnosis not present

## 2017-11-25 DIAGNOSIS — E119 Type 2 diabetes mellitus without complications: Secondary | ICD-10-CM | POA: Diagnosis not present

## 2018-02-12 DIAGNOSIS — M9901 Segmental and somatic dysfunction of cervical region: Secondary | ICD-10-CM | POA: Diagnosis not present

## 2018-02-12 DIAGNOSIS — M62838 Other muscle spasm: Secondary | ICD-10-CM | POA: Diagnosis not present

## 2018-02-12 DIAGNOSIS — M542 Cervicalgia: Secondary | ICD-10-CM | POA: Diagnosis not present

## 2018-02-13 DIAGNOSIS — M542 Cervicalgia: Secondary | ICD-10-CM | POA: Diagnosis not present

## 2018-02-13 DIAGNOSIS — M62838 Other muscle spasm: Secondary | ICD-10-CM | POA: Diagnosis not present

## 2018-02-13 DIAGNOSIS — M9901 Segmental and somatic dysfunction of cervical region: Secondary | ICD-10-CM | POA: Diagnosis not present

## 2018-02-16 DIAGNOSIS — M542 Cervicalgia: Secondary | ICD-10-CM | POA: Diagnosis not present

## 2018-02-16 DIAGNOSIS — M62838 Other muscle spasm: Secondary | ICD-10-CM | POA: Diagnosis not present

## 2018-02-16 DIAGNOSIS — M9901 Segmental and somatic dysfunction of cervical region: Secondary | ICD-10-CM | POA: Diagnosis not present

## 2018-03-31 DIAGNOSIS — E1165 Type 2 diabetes mellitus with hyperglycemia: Secondary | ICD-10-CM | POA: Diagnosis not present

## 2018-03-31 DIAGNOSIS — E1169 Type 2 diabetes mellitus with other specified complication: Secondary | ICD-10-CM | POA: Diagnosis not present

## 2018-03-31 DIAGNOSIS — E785 Hyperlipidemia, unspecified: Secondary | ICD-10-CM | POA: Diagnosis not present

## 2018-05-26 DIAGNOSIS — E1165 Type 2 diabetes mellitus with hyperglycemia: Secondary | ICD-10-CM | POA: Diagnosis not present

## 2018-05-26 DIAGNOSIS — R1013 Epigastric pain: Secondary | ICD-10-CM | POA: Diagnosis not present

## 2020-09-27 ENCOUNTER — Encounter: Payer: Self-pay | Admitting: Internal Medicine

## 2020-09-27 ENCOUNTER — Other Ambulatory Visit: Payer: Self-pay

## 2020-09-27 ENCOUNTER — Ambulatory Visit (INDEPENDENT_AMBULATORY_CARE_PROVIDER_SITE_OTHER): Payer: Commercial Managed Care - PPO | Admitting: Internal Medicine

## 2020-09-27 VITALS — BP 138/78 | HR 86 | Ht 67.0 in | Wt 200.1 lb

## 2020-09-27 DIAGNOSIS — I452 Bifascicular block: Secondary | ICD-10-CM | POA: Insufficient documentation

## 2020-09-27 DIAGNOSIS — E1169 Type 2 diabetes mellitus with other specified complication: Secondary | ICD-10-CM | POA: Insufficient documentation

## 2020-09-27 DIAGNOSIS — Z8249 Family history of ischemic heart disease and other diseases of the circulatory system: Secondary | ICD-10-CM | POA: Insufficient documentation

## 2020-09-27 DIAGNOSIS — I10 Essential (primary) hypertension: Secondary | ICD-10-CM | POA: Diagnosis not present

## 2020-09-27 DIAGNOSIS — E785 Hyperlipidemia, unspecified: Secondary | ICD-10-CM

## 2020-09-27 NOTE — Progress Notes (Signed)
New Outpatient Visit Date: 09/27/2020  Referring Provider: Maryland Pink, MD [address] [place],  [place] [address]  Chief Complaint: Family history of ASCVD  HPI:  Francisco Porter is a 55 y.o. male who is being seen today for the evaluation of cardiovascular risk assessment at the request of Dr. Kary Kos. He has a history of hypertension, hyperlipidemia, type 2 diabetes mellitus, and diverticulitis.  He notes a history of coronary artery disease on both sides of his family (see details below).  Francisco Porter reports feeling well, denying chest pain, shortness of breath, palpitations, lightheadedness, and edema.  He would like to begin exercising regularly and wishes to undergo cardiac evaluation before beginning this.  He reports being diagnosed with diabetes > 25 years ago.  His glucose has been well-controlled for years.  He was intolerant of atorvastatin in the past but is doing fine with rosuvastatin.  He has never undergone cardiac testing other than EKG's.  He is the brother of Francisco Porter [facility] STEMI coordinator).  --------------------------------------------------------------------------------------------------  Cardiovascular History & Procedures: Cardiovascular Problems: Abnormal EKG (bifascicular block)  Risk Factors: Hypertension, hyperlipidemia, diabetes mellitus, male gender, tobacco use, and family history  Cath/PCI: None  CV Surgery: None  EP Procedures and Devices: None  Non-Invasive Evaluation(s): None  Recent CV Pertinent Labs: Lab Results  Component Value Date   K 4.2 12/01/2013   K 4.0 05/02/2013   BUN 5 (L) 12/01/2013   BUN 11 05/02/2013   CREATININE 0.90 03/28/2015   CREATININE 1.01 05/02/2013    --------------------------------------------------------------------------------------------------  Past Medical History:  Diagnosis Date   Diabetes mellitus without complication ([place])    [id number]   Diverticulitis    Family history of  anesthesia complication    his mother has heart [place]   Hyperlipidemia    Hypertension     Past Surgical History:  Procedure Laterality Date   COLON SURGERY  11/2013   PARTIAL COLECTOMY FOR DIVERTICULITIS   COLONOSCOPY  2014   COLONOSCOPY WITH PROPOFOL N/A 11/29/2016   Procedure: COLONOSCOPY WITH PROPOFOL;  Surgeon: Lollie Sails, MD;  Location: [facility] ENDOSCOPY;  Service: Endoscopy;  Laterality: N/A;   HERNIA REPAIR  04/07/2015   Laparoscopic placement of a 10 x 15 ventral light mesh   INSERTION OF MESH N/A 04/07/2015   Procedure: INSERTION OF MESH;  Surgeon: Robert Bellow, MD;  Location: [facility] ORS;  Service: General;  Laterality: N/A;   JOINT REPLACEMENT     LAPAROSCOPIC SIGMOID COLECTOMY N/A 11/29/2013   Dr Thompson/Procedure: LAPAROSCOPIC ASSISTED SIGMOID COLECTOMY;  Surgeon: Georganna Skeans, MD;  Location: [facility];  Service: General;  Laterality: N/A;   LAPAROSCOPY N/A 04/07/2015   Procedure: LAPAROSCOPY DIAGNOSTIC;  Surgeon: Robert Bellow, MD;  Location: [facility] ORS;  Service: General;  Laterality: N/A;   R shoulder surgery     VENTRAL HERNIA REPAIR N/A 04/07/2015   Procedure: HERNIA REPAIR VENTRAL ADULT;  Surgeon: Robert Bellow, MD;  Location: [facility] ORS;  Service: General;  Laterality: N/A;    Current Meds  Medication Sig   aspirin EC 81 MG tablet Take 81 mg by mouth daily. Swallow whole.   CRESTOR 10 MG tablet Take 10 mg by mouth daily.    glipiZIDE (GLUCOTROL XL) 5 MG 24 hr tablet Take 5 mg by mouth daily.   losartan (COZAAR) 25 MG tablet Take 25 mg by mouth daily.   montelukast (SINGULAIR) 10 MG tablet Take 10 mg by mouth daily.   Omega-3 Fatty Acids (FISH  OIL) 1000 MG CAPS Take 2 capsules by mouth daily at 8 pm.   omeprazole (PRILOSEC) 20 MG capsule Take 20 mg by mouth daily.   psyllium (METAMUCIL) 58.6 % packet Take 1 packet by mouth daily.   SYNJARDY XR 12.05-998 MG TB24 Take by mouth daily.    Allergies: Atorvastatin and Codeine  Social History   Tobacco  Use   Smoking status: Never   Smokeless tobacco: Current    Types: Chew  Vaping Use   Vaping Use: Never used  Substance Use Topics   Alcohol use: Yes    Alcohol/week: 4.0 standard drinks    Types: 4 Cans of beer per week    Comment: 4 beers /week   Drug use: No    Family History  Problem Relation Age of Onset   Heart disease Mother    Diabetes Mother    Heart Problems Mother    Coronary artery disease Mother        CABG   Heart attack Father 51   Heart Problems Father    Heart failure Father    Bowel Disease Father    Diabetes Father    Fainting Maternal Grandfather    Fainting Paternal Grandmother    Fainting Paternal Grandfather     Review of Systems: A 12-system review of systems was performed and was negative except as noted in the HPI.  --------------------------------------------------------------------------------------------------  Physical Exam: BP 138/78 (BP Location: Right Arm, Patient Position: Sitting, Cuff Size: Normal)   Pulse 86   Ht '5\' 7"'$  (1.702 m)   Wt 200 lb 2 oz (90.8 kg)   SpO2 98%   BMI 31.34 kg/m   General:  NAD. HEENT: No conjunctival pallor or scleral icterus. Facemask in place. Neck: Supple without lymphadenopathy, thyromegaly, JVD, or HJR. No carotid bruit. Lungs: Normal work of breathing. Clear to auscultation bilaterally without wheezes or crackles. Heart: Regular rate and rhythm without murmurs, rubs, or gallops. Non-displaced PMI. Abd: Bowel sounds present. Soft, NT/ND without hepatosplenomegaly Ext: No lower extremity edema. Radial, PT, and DP pulses are 2+ bilaterally Skin: Warm and dry without rash. Neuro: CNIII-XII intact. Strength and fine-touch sensation intact in upper and lower extremities bilaterally. Psych: Normal mood and affect.  EKG:  Normal sinus rhythm with bifascicular block (LAFB  and RBBB).  No significant change since prior tracing on 04/03/2015.  Outside labs (08/10/2020): CMP: Sodium 135, potassium 4.0,  chloride 99, CO2 29, BUN 12, creatinine 0.9, glucose 102, calcium 9.3, AST 37, ALT 79, alkaline phosphatase 41, total bilirubin 0.7, total protein 7.0, albumin 4.7  Lipid panel: Total cholesterol 191, triglycerides 165, HDL 41, LDL 117  TSH: 2.033  Hemoglobin A1c: 6.7  --------------------------------------------------------------------------------------------------  ASSESSMENT AND PLAN: Bifascicular block and family history of premature ASCVD: Francisco Porter is asymptomatic but is noted to have bifascicular block on EKG today (similar to prior tracing from 03/2015).  He has numerous cardiac risk factors, most notably long-standing DM and family history of early ASCVD.  I have recommended that we obtain an echocardiogram to exclude structural abnormalities in the setting of his conduction disease.  If this does not show any significant abnormalities, we will subsequently proceed with coronary CTA.  If cardiomyopathy is demonstrated on echo, we would need to consider cardiac catheterization instead.  In the meantime, he should continue his current medications for primary prevention, including rosuvastatin and aspirin.  Hyperlipidemia associated with type 2 diabetes mellitus: Most recent lipid panel in July notable for LDL of 117, which is above  goal (target < 100, ideally < 70 especially if evidence of ASCVD is uncovered).  For now, we will continue his current dose of rosuvastatin.  If we plan to escalate therapy in the future, we will need to pay close attention to his LFT's given mildly elevated ALT on prior labs.  Hypertension: BP mildly elevated today.  We will defer medication changes today but may need to escalate regimen (likely losartan to being with) if BP remains above goal at follow-up (target < 130/80).  Follow-up: Return to clinic in 2 months.  Nelva Bush, MD 09/27/2020 7:20 PM

## 2020-09-27 NOTE — Patient Instructions (Signed)
Medication Instructions:   Your physician recommends that you continue on your current medications as directed. Please refer to the Current Medication list given to you today.  *If you need a refill on your cardiac medications before your next appointment, please call your pharmacy*   Lab Work:  None ordered  Testing/Procedures:  Your physician has requested that you have an echocardiogram (preferably in the next 1-2 weeks). Echocardiography is a painless test that uses sound waves to create images of your heart. It provides your doctor with information about the size and shape of your heart and how well your heart's chambers and valves are working. This procedure takes approximately one hour. There are no restrictions for this procedure.   Follow-Up: At [facility], you and your health needs are our priority.  As part of our continuing mission to provide you with exceptional heart care, we have created designated Provider Care Teams.  These Care Teams include your primary Cardiologist (physician) and Advanced Practice Providers (APPs -  Physician Assistants and Nurse Practitioners) who all work together to provide you with the care you need, when you need it.  We recommend signing up for the patient portal called "MyChart".  Sign up information is provided on this After Visit Summary.  MyChart is used to connect with patients for Virtual Visits (Telemedicine).  Patients are able to view lab/test results, encounter notes, upcoming appointments, etc.  Non-urgent messages can be sent to your provider as well.   To learn more about what you can do with MyChart, go to [email or web address].    Your next appointment:   2 month(s)  The format for your next appointment:   In Person  Provider:   You may see Dr. Harrell Gave End or one of the following Advanced Practice Providers on your designated Care Team:   Murray Hodgkins, NP Christell Faith, PA-C Marrianne Mood, PA-C Cadence Cache,  [place]

## 2020-10-09 ENCOUNTER — Other Ambulatory Visit: Payer: Self-pay

## 2020-10-09 ENCOUNTER — Ambulatory Visit (INDEPENDENT_AMBULATORY_CARE_PROVIDER_SITE_OTHER): Payer: Commercial Managed Care - PPO

## 2020-10-09 DIAGNOSIS — I10 Essential (primary) hypertension: Secondary | ICD-10-CM | POA: Diagnosis not present

## 2020-10-09 DIAGNOSIS — I452 Bifascicular block: Secondary | ICD-10-CM | POA: Diagnosis not present

## 2020-10-09 LAB — ECHOCARDIOGRAM COMPLETE
AR max vel: 2.01 cm2
AV Area VTI: 2.44 cm2
AV Area mean vel: 1.8 cm2
AV Mean grad: 10 mmHg
AV Peak grad: 17.6 mmHg
Ao pk vel: 2.1 m/s
Area-P 1/2: 3.6 cm2
MV VTI: 4.16 cm2
S' Lateral: 2.7 cm

## 2020-10-11 ENCOUNTER — Telehealth: Payer: Self-pay | Admitting: *Deleted

## 2020-10-11 DIAGNOSIS — R9431 Abnormal electrocardiogram [ECG] [EKG]: Secondary | ICD-10-CM

## 2020-10-11 DIAGNOSIS — I10 Essential (primary) hypertension: Secondary | ICD-10-CM

## 2020-10-11 DIAGNOSIS — I452 Bifascicular block: Secondary | ICD-10-CM

## 2020-10-11 MED ORDER — METOPROLOL TARTRATE 100 MG PO TABS: 100.0000 mg | ORAL_TABLET | Freq: Once | ORAL | 0 refills | Status: DC

## 2020-10-11 NOTE — Telephone Encounter (Signed)
-----   Message from Nelva Bush, MD sent at 10/10/2020  6:39 AM EDT ----- Please let Mr. Rothmeyer know that his echocardiogram does not show any significant structural abnormalities with his heart.  There is mild thickening of the heart muscle that is likely related to his history of hypertension.  I recommend that we move forward with coronary CTA, as discussed at our recent office visit.

## 2020-10-11 NOTE — Telephone Encounter (Signed)
Spoke with pt. Notified of echo results and Dr. Darnelle Bos recc.  Pt is scheduled for coronary CTA (see below details) 10/26/20.  Pt will have BMET in our office scheduled 10/23/20.  Reviewed CCTA instructions below with pt.  Pt voiced understanding with read back.  Pt has no further questions at this time.   Your cardiac CT is scheduled Thursday 10/26/20 at 8:00 AM at:  [facility] [place], [place] [address] [phone]   Please arrive 15 mins early for check-in and test prep.  You will need to have lab work Artist) prior:   Please follow these instructions carefully (unless otherwise directed):  Hold all erectile dysfunction medications at least 3 days (72 hrs) prior to test.  On the Night Before the Test: Be sure to Drink plenty of water. Do not consume any caffeinated/decaffeinated beverages or chocolate 12 hours prior to your test. Do not take any antihistamines 12 hours prior to your test.   On the Day of the Test: Drink plenty of water until 1 hour prior to the test. Do not eat any food 4 hours prior to the test. You may take your regular medications prior to the test.  Take metoprolol (Lopressor) two hours prior to test. HOLD Furosemide/Hydrochlorothiazide morning of the test.       After the Test: Drink plenty of water. After receiving IV contrast, you may experience a mild flushed feeling. This is normal. On occasion, you may experience a mild rash up to 24 hours after the test. This is not dangerous. If this occurs, you can take Benadryl 25 mg and increase your fluid intake. If you experience trouble breathing, this can be serious. If it is severe call 911 IMMEDIATELY. If it is mild, please call our office. If you take any of these medications: Glipizide/Metformin, Avandament, Glucavance, please do not take 48 hours after completing test unless otherwise instructed.  Please allow 2-4 weeks for scheduling of  routine cardiac CTs. Some insurance companies require a pre-authorization which may delay scheduling of this test.   For non-scheduling related questions, please contact the cardiac imaging nurse navigator should you have any questions/concerns: Marchia Bond, Cardiac Imaging Nurse Navigator Gordy Clement, Cardiac Imaging Nurse Navigator [facility] Direct Office Dial: [phone]   For scheduling needs, including cancellations and rescheduling, please call [place], [phone].

## 2020-10-19 ENCOUNTER — Ambulatory Visit: Admission: RE | Admit: 2020-10-19 | Payer: Commercial Managed Care - PPO | Source: Ambulatory Visit

## 2020-10-23 ENCOUNTER — Other Ambulatory Visit: Payer: Self-pay

## 2020-10-23 ENCOUNTER — Other Ambulatory Visit (INDEPENDENT_AMBULATORY_CARE_PROVIDER_SITE_OTHER): Payer: Commercial Managed Care - PPO

## 2020-10-23 DIAGNOSIS — R9431 Abnormal electrocardiogram [ECG] [EKG]: Secondary | ICD-10-CM

## 2020-10-23 DIAGNOSIS — I10 Essential (primary) hypertension: Secondary | ICD-10-CM

## 2020-10-23 DIAGNOSIS — I452 Bifascicular block: Secondary | ICD-10-CM | POA: Diagnosis not present

## 2020-10-24 ENCOUNTER — Telehealth (HOSPITAL_COMMUNITY): Payer: Self-pay | Admitting: *Deleted

## 2020-10-24 LAB — BASIC METABOLIC PANEL
BUN/Creatinine Ratio: 14 (ref 9–20)
BUN: 14 mg/dL (ref 6–24)
CO2: 18 mmol/L — ABNORMAL LOW (ref 20–29)
Calcium: 9.5 mg/dL (ref 8.7–10.2)
Chloride: 102 mmol/L (ref 96–106)
Creatinine, Ser: 1.03 mg/dL (ref 0.76–1.27)
Glucose: 232 mg/dL — ABNORMAL HIGH (ref 70–99)
Potassium: 4.6 mmol/L (ref 3.5–5.2)
Sodium: 141 mmol/L (ref 134–144)
eGFR: 86 mL/min/{1.73_m2} (ref >59)

## 2020-10-24 NOTE — Telephone Encounter (Signed)
Reaching out to patient to offer assistance regarding upcoming cardiac imaging study; pt verbalizes understanding of appt date/time, parking situation and where to check in, pre-test NPO status and medications ordered, and verified current allergies; name and call back number provided for further questions should they arise ° °Lyssa Hackley RN Navigator Cardiac Imaging ° Heart and Vascular °[phone] office °[phone] cell  ° °Patient to take 100mg metoprolol tartrate two hours prior to cardiac CT scan. °

## 2020-10-26 ENCOUNTER — Other Ambulatory Visit: Payer: Self-pay

## 2020-10-26 ENCOUNTER — Ambulatory Visit
Admission: RE | Admit: 2020-10-26 | Discharge: 2020-10-26 | Disposition: A | Discharge status: Home / Self Care | Payer: Commercial Managed Care - PPO | Source: Ambulatory Visit | Attending: Internal Medicine | Admitting: Internal Medicine

## 2020-10-26 ENCOUNTER — Telehealth: Payer: Self-pay | Admitting: Internal Medicine

## 2020-10-26 DIAGNOSIS — I452 Bifascicular block: Secondary | ICD-10-CM | POA: Insufficient documentation

## 2020-10-26 DIAGNOSIS — I10 Essential (primary) hypertension: Secondary | ICD-10-CM | POA: Insufficient documentation

## 2020-10-26 DIAGNOSIS — R9431 Abnormal electrocardiogram [ECG] [EKG]: Secondary | ICD-10-CM | POA: Insufficient documentation

## 2020-10-26 DIAGNOSIS — E1169 Type 2 diabetes mellitus with other specified complication: Secondary | ICD-10-CM

## 2020-10-26 DIAGNOSIS — Z79899 Other long term (current) drug therapy: Secondary | ICD-10-CM

## 2020-10-26 DIAGNOSIS — E785 Hyperlipidemia, unspecified: Secondary | ICD-10-CM

## 2020-10-26 MED ORDER — DILTIAZEM HCL 25 MG/5ML IV SOLN
10.0000 mg | Freq: Once | INTRAVENOUS | Status: AC
  Administered 2020-10-26: 10 mg via INTRAVENOUS

## 2020-10-26 MED ORDER — NITROGLYCERIN 0.4 MG SL SUBL
0.8000 mg | SUBLINGUAL_TABLET | Freq: Once | SUBLINGUAL | Status: AC
  Administered 2020-10-26: 0.8 mg via SUBLINGUAL

## 2020-10-26 MED ORDER — IOHEXOL 350 MG/ML SOLN
100.0000 mL | Freq: Once | INTRAVENOUS | Status: AC | PRN
  Administered 2020-10-26: 100 mL via INTRAVENOUS

## 2020-10-26 MED ORDER — METOPROLOL TARTRATE 5 MG/5ML IV SOLN
10.0000 mg | Freq: Once | INTRAVENOUS | Status: AC
  Administered 2020-10-26: 10 mg via INTRAVENOUS

## 2020-10-26 MED ORDER — ROSUVASTATIN CALCIUM 20 MG PO TABS: 20.0000 mg | ORAL_TABLET | Freq: Every day | ORAL | 1 refills | Status: DC

## 2020-10-26 NOTE — Telephone Encounter (Signed)
I spoke with the patient regarding his cardiac CT and Dr. Darnelle Bos recommendations to:  1) follow up with Dr. Kary Kos regarding that fat in the liver that was noted 2) increase rosuvastatin to 20 mg once daily  3) repeat a lipid/ ALT in ~ 3 months  The patient voices understanding of the above and is agreeable. He is aware I will forward a copy of his CT to Dr. Kary Kos to review.  He advised he just picked up his crestor 10 mg once daily RX. I have advised him to take 2 tablets (20 mg) once daily and we will go ahead and update the pharmacy of the dose change, but ask them to place this on hold until he calls for it.  The patient again voices understanding.  Will forward to [facility] to follow up on his lab work in 3 months. Lab orders placed.

## 2020-10-26 NOTE — Telephone Encounter (Signed)
Nelva Bush, MD  10/26/2020  4:06 PM EDT     Please let Mr. Haik know that his coronary CTA does not show any significant narrow/blockage, though there is mild plaque buildup in a couple of areas.  Incidental note of fat in the liver was also made, which could be related to his history of diabetes.  I recommend that he follow-up with Dr. Kary Kos to see if any additional liver testing is needed.  From a heart standpoint, I recommend that we increase rosuvastatin to 20 mg daily with repeat lipid panel and ALT in ~3 months.  Most recent LDL was 117 in July; I would like to get it closer to 70 to help prevent progression of the currently mild coronary artery disease.

## 2020-10-26 NOTE — Progress Notes (Signed)
Patient tolerated procedure well. Ambulate w/o difficulty. Denies light headedness or being dizzy. Sitting in chair drinking water provided. Encouraged to drink extra water today and reasoning explained. Verbalized understanding. All questions answered. ABC intact. No further needs. Discharge from procedure area w/o issues.   °

## 2020-10-31 NOTE — Telephone Encounter (Signed)
Forwarded to scheduling to place recall for fasting lipid/ALT in 3 months.

## 2020-11-29 ENCOUNTER — Other Ambulatory Visit: Payer: Self-pay

## 2020-11-29 ENCOUNTER — Ambulatory Visit (INDEPENDENT_AMBULATORY_CARE_PROVIDER_SITE_OTHER): Payer: Commercial Managed Care - PPO | Admitting: Internal Medicine

## 2020-11-29 ENCOUNTER — Encounter: Payer: Self-pay | Admitting: Internal Medicine

## 2020-11-29 VITALS — BP 120/84 | HR 90 | Ht 67.0 in | Wt 200.0 lb

## 2020-11-29 DIAGNOSIS — E1169 Type 2 diabetes mellitus with other specified complication: Secondary | ICD-10-CM | POA: Diagnosis not present

## 2020-11-29 DIAGNOSIS — E785 Hyperlipidemia, unspecified: Secondary | ICD-10-CM

## 2020-11-29 DIAGNOSIS — I452 Bifascicular block: Secondary | ICD-10-CM | POA: Diagnosis not present

## 2020-11-29 DIAGNOSIS — I2510 Atherosclerotic heart disease of native coronary artery without angina pectoris: Secondary | ICD-10-CM | POA: Diagnosis not present

## 2020-11-29 DIAGNOSIS — I10 Essential (primary) hypertension: Secondary | ICD-10-CM | POA: Diagnosis not present

## 2020-11-29 NOTE — Patient Instructions (Signed)
Medication Instructions:  Your physician recommends that you continue on your current medications as directed. Please refer to the Current Medication list given to you today.  *If you need a refill on your cardiac medications before your next appointment, please call your pharmacy*   Lab Work: Please schedule active request for lipid/ALT in December.  If you have labs (blood work) drawn today and your tests are completely normal, you will receive your results only by: [place] (if you have MyChart) OR A paper copy in the mail If you have any lab test that is abnormal or we need to change your treatment, we will call you to review the results.   Testing/Procedures: None ordered   Follow-Up: At [facility], you and your health needs are our priority.  As part of our continuing mission to provide you with exceptional heart care, we have created designated Provider Care Teams.  These Care Teams include your primary Cardiologist (physician) and Advanced Practice Providers (APPs -  Physician Assistants and Nurse Practitioners) who all work together to provide you with the care you need, when you need it.  We recommend signing up for the patient portal called "MyChart".  Sign up information is provided on this After Visit Summary.  MyChart is used to connect with patients for Virtual Visits (Telemedicine).  Patients are able to view lab/test results, encounter notes, upcoming appointments, etc.  Non-urgent messages can be sent to your provider as well.   To learn more about what you can do with MyChart, go to [email or web address].    Your next appointment:   6 month(s)  The format for your next appointment:   In Person  Provider:   You may see Nelva Bush, MD or one of the following Advanced Practice Providers on your designated Care Team:   Murray Hodgkins, NP Christell Faith, PA-C Marrianne Mood, PA-C Cadence Edison, [place]

## 2020-11-29 NOTE — Progress Notes (Signed)
Follow-up Outpatient Visit Date: 11/29/2020  Primary Care Provider: Maryland Pink, MD [place] [address]  Chief Complaint: Follow-up heart testing  HPI:  Francisco Porter is a 55 y.o. male with history of obstructive coronary artery disease, bifascicular block, hypertension, hyperlipidemia, type 2 diabetes mellitus, diverticulitis, who presents for follow-up of coronary artery disease and bifascicular block.  I met him in late August.  He was noted to have an abnormal EKG as well as family history of ASCVD.  Subsequent echo showed mild LVH and aortic valve sclerosis.  Coronary CTA showed mild plaquing of the LAD and LCx (less than 25%) with a coronary calcium score of 184 (87th percentile).  Today, Francisco Porter reports that he is feeling quite well.  He is looking forward to beginning an exercise regimen (primarily walking on his treadmill) if he receives clearance from [place].  He has not had any chest pain, shortness of breath, palpitations, lightheadedness, or edema.  He is tolerating increased dose of rosuvastatin following coronary CTA well without myalgias.  --------------------------------------------------------------------------------------------------  Cardiovascular History & Procedures: Cardiovascular Problems: Abnormal EKG (bifascicular block) Nonobstructive coronary artery disease   Risk Factors: Hypertension, hyperlipidemia, diabetes mellitus, male gender, tobacco use, and family history   Cath/PCI: None   CV Surgery: None   EP Procedures and Devices: None   Non-Invasive Evaluation(s): Coronary CTA (10/26/2020): Mild, nonobstructive CAD with mild plaquing of the LAD and LCx (less than 25%).  Left dominant system.  Coronary calcium score 184 (87th percentile for age and sex matched control).  Incidental note of hepatic steatosis. TTE (10/09/2020): Normal LV size with mild LVH.  LVEF 60-65% with normal diastolic function.  Normal RV size and  function.  Normal biatrial size.  Aortic sclerosis with borderline mild stenosis noted.  Recent CV Pertinent Labs: Lab Results  Component Value Date   K 4.6 10/23/2020   K 4.0 05/02/2013   BUN 14 10/23/2020   BUN 11 05/02/2013   CREATININE 1.03 10/23/2020   CREATININE 1.01 05/02/2013    Past medical and surgical history were reviewed and updated in EPIC.  Current Meds  Medication Sig   aspirin EC 81 MG tablet Take 81 mg by mouth daily. Swallow whole.   glipiZIDE (GLUCOTROL XL) 5 MG 24 hr tablet Take 5 mg by mouth daily.   losartan (COZAAR) 25 MG tablet Take 25 mg by mouth daily.   montelukast (SINGULAIR) 10 MG tablet Take 10 mg by mouth daily.   Omega-3 Fatty Acids (FISH OIL) 1000 MG CAPS Take 2 capsules by mouth daily at 8 pm.   omeprazole (PRILOSEC) 20 MG capsule Take 20 mg by mouth daily.   psyllium (METAMUCIL) 58.6 % packet Take 1 packet by mouth daily.   rosuvastatin (CRESTOR) 20 MG tablet Take 1 tablet (20 mg total) by mouth daily.   SYNJARDY XR 12.05-998 MG TB24 Take by mouth daily.    Allergies: Atorvastatin and Codeine  Social History   Tobacco Use   Smoking status: Never   Smokeless tobacco: Current    Types: Chew  Vaping Use   Vaping Use: Never used  Substance Use Topics   Alcohol use: Yes    Alcohol/week: 4.0 standard drinks    Types: 4 Cans of beer per week    Comment: 4 beers/week   Drug use: No    Family History  Problem Relation Age of Onset   Heart disease Mother    Diabetes Mother    Heart Problems  Mother    Coronary artery disease Mother        CABG   Heart attack Father 37   Heart Problems Father    Heart failure Father    Bowel Disease Father    Diabetes Father    Fainting Maternal Grandfather    Fainting Paternal Grandmother    Fainting Paternal Grandfather     Review of Systems: A 12-system review of systems was performed and was negative except as noted in the  HPI.  --------------------------------------------------------------------------------------------------  Physical Exam: BP 120/84 (BP Location: Left Arm, Patient Position: Sitting, Cuff Size: Large)   Pulse 90   Ht _0  (1.702 m)   Wt 200 lb (90.7 kg)   SpO2 98%   BMI 31.32 kg/m   General:  NAD. Neck: No JVD or HJR. Lungs: Clear to auscultation bilaterally without wheezes or crackles. Heart: Regular rate and rhythm without murmurs, rubs, or gallops. Abdomen: Soft, nontender, nondistended. Extremities: No lower extremity edema.   Lab Results  Component Value Date   WBC 8.5 12/01/2013   HGB 14.6 12/01/2013   HCT 42.4 12/01/2013   MCV 88.7 12/01/2013   PLT 161 12/01/2013    Lab Results  Component Value Date   NA 141 10/23/2020   K 4.6 10/23/2020   CL 102 10/23/2020   CO2 18 (L) 10/23/2020   BUN 14 10/23/2020   CREATININE 1.03 10/23/2020   GLUCOSE 232 (H) 10/23/2020   ALT 72 05/02/2013    No results found for: CHOL, HDL, LDLCALC, LDLDIRECT, TRIG, CHOLHDL  --------------------------------------------------------------------------------------------------  ASSESSMENT AND PLAN: Nonobstructive coronary artery disease: No angina reported with recent coronary CTA demonstrating mild plaquing in the LAD and LCx.  Coronary calcium score was 87th percentile for age/gender.  Given this finding and his family history of significant ASCVD, we have agreed to continue with aggressive risk factor modification including statin therapy to target an LDL less than 70, as well as tobacco cessation.  We will plan to repeat a lipid panel and ALT in late December.  I have encouraged Francisco Porter to begin exercising.  Bifascicular block: EKG not performed today but no symptoms to suggest high-grade AV block.  Plan for repeat EKG when patient follows up in 6 months.  Hyperlipidemia associated with type 2 diabetes mellitus: Francisco Porter is tolerating rosuvastatin 20 mg daily well.  Plan for lipid  panel and ALT in late December to ensure LDL is at goal (less than 70).  Hypertension: Diastolic blood pressure mildly above goal today, better at last visit.  We will defer medication changes today with plans to begin exercise.  Hopefully, this will help lower his blood pressure.  Follow-up: Return to clinic in 6 months.  Nelva Bush, MD 11/29/2020 9:47 AM

## 2021-01-03 ENCOUNTER — Other Ambulatory Visit: Payer: Commercial Managed Care - PPO

## 2021-01-08 ENCOUNTER — Other Ambulatory Visit (INDEPENDENT_AMBULATORY_CARE_PROVIDER_SITE_OTHER): Payer: Commercial Managed Care - PPO

## 2021-01-08 ENCOUNTER — Other Ambulatory Visit: Payer: Self-pay

## 2021-01-08 DIAGNOSIS — Z79899 Other long term (current) drug therapy: Secondary | ICD-10-CM

## 2021-01-08 DIAGNOSIS — E785 Hyperlipidemia, unspecified: Secondary | ICD-10-CM | POA: Diagnosis not present

## 2021-01-08 DIAGNOSIS — E1169 Type 2 diabetes mellitus with other specified complication: Secondary | ICD-10-CM | POA: Diagnosis not present

## 2021-01-09 LAB — LIPID PANEL
Chol/HDL Ratio: 4.4 ratio (ref 0.0–5.0)
Cholesterol, Total: 160 mg/dL (ref 100–199)
HDL: 36 mg/dL — ABNORMAL LOW (ref >39)
LDL Chol Calc (NIH): 105 mg/dL — ABNORMAL HIGH (ref 0–99)
Triglycerides: 101 mg/dL (ref 0–149)
VLDL Cholesterol Cal: 19 mg/dL (ref 5–40)

## 2021-01-09 LAB — ALT: ALT: 77 IU/L — ABNORMAL HIGH (ref 0–44)

## 2021-01-10 ENCOUNTER — Telehealth: Payer: Self-pay | Admitting: *Deleted

## 2021-01-10 DIAGNOSIS — Z79899 Other long term (current) drug therapy: Secondary | ICD-10-CM

## 2021-01-10 DIAGNOSIS — E1169 Type 2 diabetes mellitus with other specified complication: Secondary | ICD-10-CM

## 2021-01-10 NOTE — Telephone Encounter (Signed)
-----   Message from Nelva Bush, MD sent at 01/10/2021 10:58 AM EST ----- Please let Francisco Porter know that his lipids are notable for mildly elevated LDL and slightly low HDL.  His ALT is mildly elevated, similar to July.  I suggest that we increase rosuvastatin to 40 mg daily and recheck a lipid panel and ALT in 6 weeks.  If he develops any abdominal pain or jaundice, he should let us know right away.  He should also follow-up with Dr. Kary Kos for further evaluation/management of his abnormal ALT.

## 2021-01-10 NOTE — Telephone Encounter (Signed)
Attempted to call pt. No answer. Lmtcb.  

## 2021-01-11 NOTE — Telephone Encounter (Signed)
Patient returning call.

## 2021-01-12 MED ORDER — ROSUVASTATIN CALCIUM 40 MG PO TABS: 40.0000 mg | ORAL_TABLET | Freq: Every day | ORAL | 2 refills | Status: DC

## 2021-01-12 NOTE — Telephone Encounter (Signed)
Spoke to pt. Notified of lab results and Dr. Darnelle Bos recc.  Pt voiced understanding. Pt will:  - Increase rosuvastatin to 40 mg daily - Recheck fasting lipid and ALT in 6 weeks  - Contact Dr. Kary Kos re further evaluation/management of his abnormal ALT - Pt will let us know if he develops any abdominal pain or jaundice  Rx sent to pharmacy Lab orders placed Pt scheduled for lipid/alt in our office 02/26/21 Labs faxed to Dr. Kary Kos

## 2021-02-12 ENCOUNTER — Telehealth: Payer: Self-pay | Admitting: Internal Medicine

## 2021-02-12 ENCOUNTER — Other Ambulatory Visit: Payer: Self-pay

## 2021-02-12 MED ORDER — ROSUVASTATIN CALCIUM 40 MG PO TABS: 40.0000 mg | ORAL_TABLET | Freq: Every day | ORAL | 3 refills | Status: DC

## 2021-02-12 NOTE — Telephone Encounter (Signed)
°*  STAT* If patient is at the pharmacy, call can be transferred to refill team.   1. Which medications need to be refilled? (please list name of each medication and dose if known) rosuvastatin 40 MG 1 tablet daily   2. Which pharmacy/location (including street and city if local pharmacy) is medication to be sent to? [facility] [place]   3. Do they need a 30 day or 90 day supply? 90 day

## 2021-02-12 NOTE — Telephone Encounter (Signed)
rosuvastatin (CRESTOR) 40 MG tablet 90 tablet 3 02/12/2021 11/09/2021   Sig - Route: Take 1 tablet (40 mg total) by mouth daily. - Oral   Sent to pharmacy as: rosuvastatin (CRESTOR) 40 MG tablet   E-Prescribing Status: Receipt confirmed by pharmacy (02/12/2021  1:42 PM EST)    Pharmacy  [id number] - [place], [place]

## 2021-02-26 ENCOUNTER — Other Ambulatory Visit: Payer: Self-pay

## 2021-02-26 ENCOUNTER — Other Ambulatory Visit (INDEPENDENT_AMBULATORY_CARE_PROVIDER_SITE_OTHER): Payer: Commercial Managed Care - PPO

## 2021-02-26 DIAGNOSIS — Z79899 Other long term (current) drug therapy: Secondary | ICD-10-CM

## 2021-02-26 DIAGNOSIS — E785 Hyperlipidemia, unspecified: Secondary | ICD-10-CM | POA: Diagnosis not present

## 2021-02-26 DIAGNOSIS — E1169 Type 2 diabetes mellitus with other specified complication: Secondary | ICD-10-CM

## 2021-02-27 LAB — LIPID PANEL
Chol/HDL Ratio: 4.8 ratio (ref 0.0–5.0)
Cholesterol, Total: 157 mg/dL (ref 100–199)
HDL: 33 mg/dL — ABNORMAL LOW (ref >39)
LDL Chol Calc (NIH): 98 mg/dL (ref 0–99)
Triglycerides: 147 mg/dL (ref 0–149)
VLDL Cholesterol Cal: 26 mg/dL (ref 5–40)

## 2021-02-27 LAB — ALT: ALT: 65 IU/L — ABNORMAL HIGH (ref 0–44)

## 2021-06-29 ENCOUNTER — Ambulatory Visit (INDEPENDENT_AMBULATORY_CARE_PROVIDER_SITE_OTHER): Payer: Commercial Managed Care - PPO | Admitting: Medical

## 2021-06-29 ENCOUNTER — Encounter: Payer: Self-pay | Admitting: Medical

## 2021-06-29 VITALS — BP 128/80 | HR 80 | Ht 67.0 in | Wt 200.4 lb

## 2021-06-29 DIAGNOSIS — I10 Essential (primary) hypertension: Secondary | ICD-10-CM

## 2021-06-29 DIAGNOSIS — E1169 Type 2 diabetes mellitus with other specified complication: Secondary | ICD-10-CM | POA: Diagnosis not present

## 2021-06-29 DIAGNOSIS — I2510 Atherosclerotic heart disease of native coronary artery without angina pectoris: Secondary | ICD-10-CM | POA: Diagnosis not present

## 2021-06-29 DIAGNOSIS — I452 Bifascicular block: Secondary | ICD-10-CM

## 2021-06-29 DIAGNOSIS — E785 Hyperlipidemia, unspecified: Secondary | ICD-10-CM

## 2021-06-29 NOTE — Patient Instructions (Signed)
Medication Instructions:  - Your physician recommends that you continue on your current medications as directed. Please refer to the Current Medication list given to you today.  *If you need a refill on your cardiac medications before your next appointment, please call your pharmacy*   Lab Work: - none ordered  If you have labs (blood work) drawn today and your tests are completely normal, you will receive your results only by: [facility] (if you have MyChart) OR A paper copy in the mail If you have any lab test that is abnormal or we need to change your treatment, we will call you to review the results.   Testing/Procedures: - none ordered   Follow-Up: At [facility], you and your health needs are our priority.  As part of our continuing mission to provide you with exceptional heart care, we have created designated Provider Care Teams.  These Care Teams include your primary Cardiologist (physician) and Advanced Practice Providers (APPs -  Physician Assistants and Nurse Practitioners) who all work together to provide you with the care you need, when you need it.  We recommend signing up for the patient portal called "MyChart".  Sign up information is provided on this After Visit Summary.  MyChart is used to connect with patients for Virtual Visits (Telemedicine).  Patients are able to view lab/test results, encounter notes, upcoming appointments, etc.  Non-urgent messages can be sent to your provider as well.   To learn more about what you can do with MyChart, go to [email or web address].    Your next appointment:   1 year(s)  The format for your next appointment:   In Person  Provider:   You may see Nelva Bush, MD or one of the following Advanced Practice Providers on your designated Care Team:    Cadence Kathlen Mody, [place]    Other Instructions N/a  Important Information About Sugar

## 2021-06-29 NOTE — Progress Notes (Signed)
Cardiology Office Note:    Date:  06/29/2021   ID:  Francisco Porter, DOB 1966-01-19, MRN [id number]  PCP:  Maryland Pink, MD  [facility] Cardiologist:  Nelva Bush, MD  [facility] Electrophysiologist:  None   Referring MD: Maryland Pink, MD   Chief Complaint: 6 month follow-up  History of Present Illness:    Francisco Porter is a 56 y.o. male with a hx of CAD, bifasicular block, HTN, HLD, and DM2 who presents for 6 month follow-up.   In August 2022 the patient was noted to have an abnormal EKG. Echo showed mild LVH and aortic valve sclerosis. Coronary CTA showed mild plaquing of the LAD and Lcx with a coronary calcium score of 184.   Last seen 11/2020 and was overall feeling well.   Today, the patient has been overall doing well. Patient is not very active at baseline, work is sedentary. He does things around the house. No chest pain, Shortness of breath, dizziness, lightheadedness, LLE, orthopnea, pnd. Diet is not healthy. Sees PCP regularly, he will check labs. BP and HR are good today.   Past Medical History:  Diagnosis Date   Diabetes mellitus without complication ([place])    6270   Diverticulitis    Family history of anesthesia complication    his mother has heart [place]   Hyperlipidemia    Hypertension     Past Surgical History:  Procedure Laterality Date   COLON SURGERY  11/2013   PARTIAL COLECTOMY FOR DIVERTICULITIS   COLONOSCOPY  2014   COLONOSCOPY WITH PROPOFOL N/A 11/29/2016   Procedure: COLONOSCOPY WITH PROPOFOL;  Surgeon: Lollie Sails, MD;  Location: [facility] ENDOSCOPY;  Service: Endoscopy;  Laterality: N/A;   HERNIA REPAIR  04/07/2015   Laparoscopic placement of a 10 x 15 ventral light mesh   INSERTION OF MESH N/A 04/07/2015   Procedure: INSERTION OF MESH;  Surgeon: Robert Bellow, MD;  Location: [facility] ORS;  Service: General;  Laterality: N/A;   JOINT REPLACEMENT     LAPAROSCOPIC SIGMOID COLECTOMY N/A 11/29/2013   Dr Thompson/Procedure: LAPAROSCOPIC  ASSISTED SIGMOID COLECTOMY;  Surgeon: Georganna Skeans, MD;  Location: [facility];  Service: General;  Laterality: N/A;   LAPAROSCOPY N/A 04/07/2015   Procedure: LAPAROSCOPY DIAGNOSTIC;  Surgeon: Robert Bellow, MD;  Location: [facility] ORS;  Service: General;  Laterality: N/A;   R shoulder surgery     VENTRAL HERNIA REPAIR N/A 04/07/2015   Procedure: HERNIA REPAIR VENTRAL ADULT;  Surgeon: Robert Bellow, MD;  Location: [facility] ORS;  Service: General;  Laterality: N/A;    Current Medications: Current Meds  Medication Sig   aspirin EC 81 MG tablet Take 81 mg by mouth daily. Swallow whole.   glipiZIDE (GLUCOTROL XL) 5 MG 24 hr tablet Take 5 mg by mouth daily.   losartan (COZAAR) 25 MG tablet Take 25 mg by mouth daily.   montelukast (SINGULAIR) 10 MG tablet Take 10 mg by mouth daily.   Omega-3 Fatty Acids (FISH OIL) 1000 MG CAPS Take 2 capsules by mouth daily at 8 pm.   omeprazole (PRILOSEC) 20 MG capsule Take 20 mg by mouth daily.   psyllium (METAMUCIL) 58.6 % packet Take 1 packet by mouth daily.   rosuvastatin (CRESTOR) 40 MG tablet Take 1 tablet (40 mg total) by mouth daily.   SYNJARDY XR 12.05-998 MG TB24 Take by mouth daily.     Allergies:   Atorvastatin and Codeine   Social History   Socioeconomic History   Marital  status: Legally Separated    Spouse name: Not on file   Number of children: Not on file   Years of education: Not on file   Highest education level: Not on file  Occupational History   Not on file  Tobacco Use   Smoking status: Never   Smokeless tobacco: Current    Types: Chew  Vaping Use   Vaping Use: Never used  Substance and Sexual Activity   Alcohol use: Yes    Alcohol/week: 4.0 standard drinks    Types: 4 Cans of beer per week    Comment: 4 beers/week   Drug use: No   Sexual activity: Yes    Birth control/protection: None  Other Topics Concern   Not on file  Social History Narrative   Not on file   Social Determinants of Health   Financial Resource  Strain: Not on file  Food Insecurity: Not on file  Transportation Needs: Not on file  Physical Activity: Not on file  Stress: Not on file  Social Connections: Not on file     Family History: The patient's family history includes Bowel Disease in his father; Coronary artery disease in his mother; Diabetes in his father and mother; Fainting in his maternal grandfather, paternal grandfather, and paternal grandmother; Heart Problems in his father and mother; Heart attack (age of onset: 15) in his father; Heart disease in his mother; Heart failure in his father.  ROS:   Please see the history of present illness.     All other systems reviewed and are negative.  EKGs/Labs/Other Studies Reviewed:    The following studies were reviewed today:  Coronary CTA (10/26/2020): Mild, nonobstructive CAD with mild plaquing of the LAD and LCx (less than 25%).  Left dominant system.  Coronary calcium score 184 (87th percentile for age and sex matched control).  Incidental note of hepatic steatosis.  TTE (10/09/2020): Normal LV size with mild LVH.  LVEF 60-65% with normal diastolic function.  Normal RV size and function.  Normal biatrial size.  Aortic sclerosis with borderline mild stenosis noted.  EKG:  EKG is  ordered today.  The ekg ordered today demonstrates NSR, 80bpm, PACs, iRBBB, LAFB, no changes  Recent Labs: 10/23/2020: BUN 14; Creatinine, Ser 1.03; Potassium 4.6; Sodium 141 02/26/2021: ALT 65  Recent Lipid Panel    Component Value Date/Time   CHOL 157 02/26/2021 0904   TRIG 147 02/26/2021 0904   HDL 33 (L) 02/26/2021 0904   CHOLHDL 4.8 02/26/2021 0904   LDLCALC 98 02/26/2021 0904     Physical Exam:    VS:  BP 128/80 (BP Location: Left Arm, Patient Position: Sitting, Cuff Size: Normal)   Pulse 80   Ht '5\' 7"'$  (1.702 m)   Wt 200 lb 6.4 oz (90.9 kg)   SpO2 98%   BMI 31.39 kg/m     Wt Readings from Last 3 Encounters:  06/29/21 200 lb 6.4 oz (90.9 kg)  11/29/20 200 lb (90.7 kg)   09/27/20 200 lb 2 oz (90.8 kg)     GEN:  Well nourished, well developed in no acute distress HEENT: Normal NECK: No JVD; No carotid bruits LYMPHATICS: No lymphadenopathy CARDIAC: RRR, + murmur, no rubs, gallops RESPIRATORY:  Clear to auscultation without rales, wheezing or rhonchi  ABDOMEN: Soft, non-tender, non-distended MUSCULOSKELETAL:  No edema; No deformity  SKIN: Warm and dry NEUROLOGIC:  Alert and oriented x 3 PSYCHIATRIC:  Normal affect   ASSESSMENT:    1. Coronary artery disease involving native coronary  artery of native heart without angina pectoris   2. Bifascicular block   3. Essential hypertension   4. Hyperlipidemia associated with type 2 diabetes mellitus ([place])    PLAN:    In order of problems listed above:  Nonobstructive CAD Cardiac CTA 09/2020 showed non-obstructive CAD. He denies anginal symptoms. Continue medical management with Aspirin and statin therapy. No further ischemic work-up indicated.   Bifasicular block EKG today with no high grade heart block, it is otherwise unchanged.No signs or symptoms of heart block.   HLD Most recent LDL 98, goal less than 70. He is on Crestor '40mg'$  daily. Encouraged healthy lifestyle changes.   HTN BP is good today, continue current medications. PCP will check general labs.   DM2 Most recent A1C 7.1, followed by PCP.   Disposition: Follow up in 1 year(s) with MD/APP    Signed, Destiny Hagin Ninfa Meeker, PA-C  06/29/2021 9:15 AM    [facility]

## 2022-03-06 ENCOUNTER — Inpatient Hospital Stay
Admission: EM | Admit: 2022-03-06 | Discharge: 2022-03-08 | DRG: 389 | Disposition: A | Discharge status: Home / Self Care | Payer: Commercial Managed Care - PPO | Attending: Surgery | Admitting: Surgery

## 2022-03-06 ENCOUNTER — Encounter: Payer: Self-pay | Admitting: Emergency Medicine

## 2022-03-06 ENCOUNTER — Other Ambulatory Visit: Payer: Self-pay

## 2022-03-06 ENCOUNTER — Emergency Department: Payer: Commercial Managed Care - PPO

## 2022-03-06 ENCOUNTER — Inpatient Hospital Stay: Payer: Commercial Managed Care - PPO

## 2022-03-06 DIAGNOSIS — Z7982 Long term (current) use of aspirin: Secondary | ICD-10-CM | POA: Diagnosis not present

## 2022-03-06 DIAGNOSIS — K56609 Unspecified intestinal obstruction, unspecified as to partial versus complete obstruction: Principal | ICD-10-CM | POA: Diagnosis present

## 2022-03-06 DIAGNOSIS — Z885 Allergy status to narcotic agent status: Secondary | ICD-10-CM

## 2022-03-06 DIAGNOSIS — Z79899 Other long term (current) drug therapy: Secondary | ICD-10-CM

## 2022-03-06 DIAGNOSIS — Z888 Allergy status to other drugs, medicaments and biological substances status: Secondary | ICD-10-CM | POA: Diagnosis not present

## 2022-03-06 DIAGNOSIS — Z8249 Family history of ischemic heart disease and other diseases of the circulatory system: Secondary | ICD-10-CM | POA: Diagnosis not present

## 2022-03-06 DIAGNOSIS — E785 Hyperlipidemia, unspecified: Secondary | ICD-10-CM | POA: Diagnosis present

## 2022-03-06 DIAGNOSIS — I2510 Atherosclerotic heart disease of native coronary artery without angina pectoris: Secondary | ICD-10-CM | POA: Diagnosis present

## 2022-03-06 DIAGNOSIS — E872 Acidosis, unspecified: Secondary | ICD-10-CM | POA: Diagnosis not present

## 2022-03-06 DIAGNOSIS — E1169 Type 2 diabetes mellitus with other specified complication: Secondary | ICD-10-CM | POA: Diagnosis present

## 2022-03-06 DIAGNOSIS — I10 Essential (primary) hypertension: Secondary | ICD-10-CM | POA: Diagnosis present

## 2022-03-06 DIAGNOSIS — I452 Bifascicular block: Secondary | ICD-10-CM | POA: Diagnosis present

## 2022-03-06 DIAGNOSIS — Z720 Tobacco use: Secondary | ICD-10-CM | POA: Diagnosis not present

## 2022-03-06 DIAGNOSIS — Z9049 Acquired absence of other specified parts of digestive tract: Secondary | ICD-10-CM

## 2022-03-06 DIAGNOSIS — E639 Nutritional deficiency, unspecified: Secondary | ICD-10-CM | POA: Diagnosis present

## 2022-03-06 DIAGNOSIS — Z79890 Hormone replacement therapy (postmenopausal): Secondary | ICD-10-CM

## 2022-03-06 DIAGNOSIS — E119 Type 2 diabetes mellitus without complications: Secondary | ICD-10-CM

## 2022-03-06 LAB — CBC WITH DIFFERENTIAL/PLATELET
Abs Immature Granulocytes: 0.05 10*3/uL (ref 0.00–0.07)
Basophils Absolute: 0 10*3/uL (ref 0.0–0.1)
Basophils Relative: 0 %
Eosinophils Absolute: 0 10*3/uL (ref 0.0–0.5)
Eosinophils Relative: 0 %
HCT: 50.9 % (ref 39.0–52.0)
Hemoglobin: 17.6 g/dL — ABNORMAL HIGH (ref 13.0–17.0)
Immature Granulocytes: 0 %
Lymphocytes Relative: 9 %
Lymphs Abs: 1.1 10*3/uL (ref 0.7–4.0)
MCH: 30.6 pg (ref 26.0–34.0)
MCHC: 34.6 g/dL (ref 30.0–36.0)
MCV: 88.5 fL (ref 80.0–100.0)
Monocytes Absolute: 0.6 10*3/uL (ref 0.1–1.0)
Monocytes Relative: 5 %
Neutro Abs: 10.9 10*3/uL — ABNORMAL HIGH (ref 1.7–7.7)
Neutrophils Relative %: 86 %
Platelets: 224 10*3/uL (ref 150–400)
RBC: 5.75 MIL/uL (ref 4.22–5.81)
RDW: 12.2 % (ref 11.5–15.5)
WBC: 12.7 10*3/uL — ABNORMAL HIGH (ref 4.0–10.5)
nRBC: 0 % (ref 0.0–0.2)

## 2022-03-06 LAB — GLUCOSE, CAPILLARY
Glucose-Capillary: 87 mg/dL (ref 70–99)
Glucose-Capillary: 100 mg/dL — ABNORMAL HIGH (ref 70–99)
Glucose-Capillary: 99 mg/dL (ref 70–99)

## 2022-03-06 LAB — COMPREHENSIVE METABOLIC PANEL
ALT: 36 U/L (ref 0–44)
AST: 25 U/L (ref 15–41)
Albumin: 4.8 g/dL (ref 3.5–5.0)
Alkaline Phosphatase: 59 U/L (ref 38–126)
Anion gap: 18 — ABNORMAL HIGH (ref 5–15)
BUN: 14 mg/dL (ref 6–20)
CO2: 19 mmol/L — ABNORMAL LOW (ref 22–32)
Calcium: 9.7 mg/dL (ref 8.9–10.3)
Chloride: 102 mmol/L (ref 98–111)
Creatinine, Ser: 0.97 mg/dL (ref 0.61–1.24)
GFR, Estimated: >60 mL/min (ref >60)
Glucose, Bld: 99 mg/dL (ref 70–99)
Potassium: 4.1 mmol/L (ref 3.5–5.1)
Sodium: 139 mmol/L (ref 135–145)
Total Bilirubin: 1.4 mg/dL — ABNORMAL HIGH (ref 0.3–1.2)
Total Protein: 8 g/dL (ref 6.5–8.1)

## 2022-03-06 LAB — LIPASE, BLOOD: Lipase: 33 U/L (ref 11–51)

## 2022-03-06 LAB — TROPONIN I (HIGH SENSITIVITY)
Troponin I (High Sensitivity): 11 ng/L (ref <18)
Troponin I (High Sensitivity): 14 ng/L (ref <18)

## 2022-03-06 LAB — HEMOGLOBIN A1C
Hgb A1c MFr Bld: 5.9 % — ABNORMAL HIGH (ref 4.8–5.6)
Mean Plasma Glucose: 122.63 mg/dL

## 2022-03-06 MED ORDER — IOHEXOL 300 MG/ML  SOLN
100.0000 mL | Freq: Once | INTRAMUSCULAR | Status: AC | PRN
  Administered 2022-03-06: 100 mL via INTRAVENOUS

## 2022-03-06 MED ORDER — INSULIN ASPART 100 UNIT/ML IJ SOLN
0.0000 [IU] | INTRAMUSCULAR | Status: DC
  Administered 2022-03-07: 1 [IU] via SUBCUTANEOUS
  Filled 2022-03-06: qty 1

## 2022-03-06 MED ORDER — HYDROMORPHONE HCL 1 MG/ML IJ SOLN
1.0000 mg | INTRAMUSCULAR | Status: DC | PRN
  Administered 2022-03-06 – 2022-03-07 (×3): 1 mg via INTRAVENOUS
  Filled 2022-03-06 (×4): qty 1

## 2022-03-06 MED ORDER — MONTELUKAST SODIUM 10 MG PO TABS
10.0000 mg | ORAL_TABLET | Freq: Every day | ORAL | Status: DC
  Administered 2022-03-06 – 2022-03-07 (×2): 10 mg via ORAL
  Filled 2022-03-06 (×2): qty 1

## 2022-03-06 MED ORDER — ONDANSETRON HCL 4 MG/2ML IJ SOLN
4.0000 mg | Freq: Once | INTRAMUSCULAR | Status: AC
  Administered 2022-03-06: 4 mg via INTRAVENOUS
  Filled 2022-03-06: qty 2

## 2022-03-06 MED ORDER — LOSARTAN POTASSIUM 25 MG PO TABS
25.0000 mg | ORAL_TABLET | Freq: Every day | ORAL | Status: DC
  Administered 2022-03-07 – 2022-03-08 (×2): 25 mg via ORAL
  Filled 2022-03-06 (×3): qty 1

## 2022-03-06 MED ORDER — POTASSIUM CHLORIDE 2 MEQ/ML IV SOLN
INTRAVENOUS | Status: DC
  Filled 2022-03-06 (×4): qty 1000

## 2022-03-06 MED ORDER — SODIUM CHLORIDE 0.9 % IV BOLUS
1000.0000 mL | Freq: Once | INTRAVENOUS | Status: AC
  Administered 2022-03-06: 1000 mL via INTRAVENOUS

## 2022-03-06 MED ORDER — HEPARIN SODIUM (PORCINE) 5000 UNIT/ML IJ SOLN
5000.0000 [IU] | Freq: Three times a day (TID) | INTRAMUSCULAR | Status: DC
  Administered 2022-03-06 – 2022-03-08 (×7): 5000 [IU] via SUBCUTANEOUS
  Filled 2022-03-06 (×7): qty 1

## 2022-03-06 MED ORDER — FENTANYL CITRATE PF 50 MCG/ML IJ SOSY
50.0000 ug | PREFILLED_SYRINGE | Freq: Once | INTRAMUSCULAR | Status: AC
  Administered 2022-03-06: 50 ug via INTRAVENOUS
  Filled 2022-03-06: qty 1

## 2022-03-06 MED ORDER — HYDRALAZINE HCL 20 MG/ML IJ SOLN: 10.0000 mg | INTRAMUSCULAR | Status: DC | PRN

## 2022-03-06 MED ORDER — DIPHENHYDRAMINE HCL 25 MG PO CAPS: 25.0000 mg | ORAL_CAPSULE | Freq: Four times a day (QID) | ORAL | Status: DC | PRN

## 2022-03-06 MED ORDER — PANTOPRAZOLE SODIUM 40 MG IV SOLR
40.0000 mg | Freq: Every day | INTRAVENOUS | Status: DC
  Administered 2022-03-06 – 2022-03-07 (×2): 40 mg via INTRAVENOUS
  Filled 2022-03-06 (×2): qty 10

## 2022-03-06 MED ORDER — PHENOL 1.4 % MT LIQD
1.0000 | OROMUCOSAL | Status: DC | PRN
  Administered 2022-03-07: 1 via OROMUCOSAL
  Filled 2022-03-06: qty 177

## 2022-03-06 MED ORDER — DIPHENHYDRAMINE HCL 50 MG/ML IJ SOLN: 25.0000 mg | Freq: Four times a day (QID) | INTRAMUSCULAR | Status: DC | PRN

## 2022-03-06 MED ORDER — ONDANSETRON 4 MG PO TBDP: 4.0000 mg | ORAL_TABLET | Freq: Four times a day (QID) | ORAL | Status: DC | PRN

## 2022-03-06 MED ORDER — ONDANSETRON HCL 4 MG/2ML IJ SOLN
4.0000 mg | Freq: Four times a day (QID) | INTRAMUSCULAR | Status: DC | PRN
  Administered 2022-03-06 – 2022-03-07 (×3): 4 mg via INTRAVENOUS
  Filled 2022-03-06 (×4): qty 2

## 2022-03-06 MED ORDER — MORPHINE SULFATE (PF) 4 MG/ML IV SOLN
6.0000 mg | Freq: Once | INTRAVENOUS | Status: AC
  Administered 2022-03-06: 6 mg via INTRAVENOUS
  Filled 2022-03-06: qty 2

## 2022-03-06 NOTE — ED Triage Notes (Signed)
First nurse Note:  C/O abdominal pain and vomiting.  Abdominal pain x 1 day, voimting started overnight.

## 2022-03-06 NOTE — ED Provider Notes (Addendum)
[facility] Provider Note    Event Date/Time   First MD Initiated Contact with Patient 03/06/22 1121     (approximate)   History   Abdominal Pain   HPI  Francisco Porter is a 57 y.o. male past med history significant for diverticulitis with prior colectomy, who presents to the emergency department with abdominal pain associated with nausea and vomiting.  Endorses epigastric and middle abdominal pain that started yesterday after lunchtime.  Multiple episodes of vomiting since that time.  Denies any fever.  Denies any significant chest pain or shortness of breath.  Last bowel movement was yesterday.  Denies any significant blood in the stool.  No melena.  Recent travel to [place].  No diarrhea.     Physical Exam   Triage Vital Signs: ED Triage Vitals  Enc Vitals Group     BP 03/06/22 1120 127/82     Pulse Rate 03/06/22 1120 89     Resp 03/06/22 1120 18     Temp 03/06/22 1120 97.8 F (36.6 C)     Temp Source 03/06/22 1120 Oral     SpO2 03/06/22 1120 98 %     Weight --      Height --      Head Circumference --      Peak Flow --      Pain Score 03/06/22 1119 10     Pain Loc --      Pain Edu? --      Excl. in [place]? --     Most recent vital signs: Vitals:   03/06/22 1300 03/06/22 1400  BP:  133/80  Pulse: 83 100  Resp:    Temp:    SpO2: 98% 96%    Physical Exam Constitutional:      Appearance: He is well-developed.  HENT:     Head: Atraumatic.  Eyes:     Conjunctiva/sclera: Conjunctivae normal.  Cardiovascular:     Rate and Rhythm: Regular rhythm.  Pulmonary:     Effort: No respiratory distress.  Abdominal:     Tenderness: There is abdominal tenderness in the right lower quadrant.  Musculoskeletal:     Cervical back: Normal range of motion.  Skin:    General: Skin is warm.  Neurological:     Mental Status: He is alert. Mental status is at baseline.     IMPRESSION / MDM / ASSESSMENT AND PLAN / ED COURSE  I reviewed the triage vital  signs and the nursing notes.  Differential diagnosis including acute appendicitis, gastritis/PUD, viral illness including COVID/influenza, ACS, diverticulitis, SBO  EKG  I, Nathaniel Man, the attending physician, personally viewed and interpreted this ECG.   Rate: Normal  Rhythm: Normal sinus  Axis: Normal  Intervals: Normal  ST&T Change: None  No tachycardic or bradycardic dysrhythmias while on cardiac telemetry.  RADIOLOGY I independently reviewed imaging, my interpretation of imaging: CT abdomen and pelvis with contrast significantly distended.  Concern for possible small bowel obstruction.  Read as small bowel obstruction with obvious transition point in the right upper quadrant.  LABS (all labs ordered are listed, but only abnormal results are displayed) Labs interpreted as -   Mildly low CO2 of 19.  Mild leukocytosis. Labs Reviewed  COMPREHENSIVE METABOLIC PANEL - Abnormal; Notable for the following components:      Result Value   CO2 19 (*)    Total Bilirubin 1.4 (*)    Anion gap 18 (*)    All other components within  normal limits  CBC WITH DIFFERENTIAL/PLATELET - Abnormal; Notable for the following components:   WBC 12.7 (*)    Hemoglobin 17.6 (*)    Neutro Abs 10.9 (*)    All other components within normal limits  LIPASE, BLOOD  HEMOGLOBIN A1C  HIV ANTIBODY (ROUTINE TESTING W REFLEX)  TROPONIN I (HIGH SENSITIVITY)  TROPONIN I (HIGH SENSITIVITY)    TREATMENT  IV fentanyl, 1 L of IV fluids, IV Zofran  MDM  Consulted and discussed the patient's case with general surgery Dr. Christian Mate evaluated the patient's images, recommended admission.  Patient admitted to general surgery for small bowel obstruction with transition point.     PROCEDURES:  Critical Care performed: No  Procedures  Patient's presentation is most consistent with acute presentation with potential threat to life or bodily function.   MEDICATIONS ORDERED IN ED: Medications  heparin  injection 5,000 Units (has no administration in time range)  lactated ringers 1,000 mL with potassium chloride 20 mEq infusion (has no administration in time range)  HYDROmorphone (DILAUDID) injection 1 mg (has no administration in time range)  diphenhydrAMINE (BENADRYL) capsule 25 mg (has no administration in time range)    Or  diphenhydrAMINE (BENADRYL) injection 25 mg (has no administration in time range)  ondansetron (ZOFRAN-ODT) disintegrating tablet 4 mg (has no administration in time range)    Or  ondansetron (ZOFRAN) injection 4 mg (has no administration in time range)  pantoprazole (PROTONIX) injection 40 mg (has no administration in time range)  hydrALAZINE (APRESOLINE) injection 10 mg (has no administration in time range)  phenol (CHLORASEPTIC) mouth spray 1 spray (has no administration in time range)  insulin aspart (novoLOG) injection 0-9 Units (has no administration in time range)  fentaNYL (SUBLIMAZE) injection 50 mcg (50 mcg Intravenous Given 03/06/22 1147)  sodium chloride 0.9 % bolus 1,000 mL (0 mLs Intravenous Stopped 03/06/22 1311)  ondansetron (ZOFRAN) injection 4 mg (4 mg Intravenous Given 03/06/22 1147)  iohexol (OMNIPAQUE) 300 MG/ML solution 100 mL (100 mLs Intravenous Contrast Given 03/06/22 1227)  fentaNYL (SUBLIMAZE) injection 50 mcg (50 mcg Intravenous Given 03/06/22 1349)    FINAL CLINICAL IMPRESSION(S) / ED DIAGNOSES   Final diagnoses:  SBO (small bowel obstruction) ([place])     Rx / DC Orders   ED Discharge Orders     None        Note:  This document was prepared using Dragon voice recognition software and may include unintentional dictation errors.   Nathaniel Man, MD 03/06/22 1352    Nathaniel Man, MD 03/06/22 1435

## 2022-03-06 NOTE — ED Notes (Signed)
Informed RN bed assigned 

## 2022-03-06 NOTE — ED Triage Notes (Signed)
Patient to ED for abd pain that started yesterday w/ N/V. Last BM yesterday. Hx of diverticulosis and perf bowel.

## 2022-03-06 NOTE — H&P (Signed)
Patient ID: Francisco Porter, male   DOB: 1966/01/05, 57 y.o.   MRN: [id number]  Chief Complaint: Vomiting, abdominal pain  History of Present Illness Francisco Porter is a 57 y.o. male with a day history of intermittent waves of abdominal pain with associated nausea and vomiting.  Patient denies any bilious emesis, reports some distention.  Last normal bowel movement was yesterday.  Reports flatus today.  Usually moves his bowels daily or every other day at worst.  Denies fevers and chills.  Prior surgical history includes laparoscopic assisted sigmoid colectomy in 2015 along with a ventral hernia repair with IPOM mesh completed laparoscopically in 2017.  No prior history of small bowel obstruction.  Currently denies nausea, but still has epigastric pain, and has complained of pyrosis/heartburn.  Last episode of vomiting was earlier this morning about 530, currently 2:30 in the afternoon.  Is willing to proceed with NG tube placement to help.  Past Medical History Past Medical History:  Diagnosis Date   Diabetes mellitus without complication ([place])    1655   Diverticulitis    Family history of anesthesia complication    his mother has heart [place]   Hyperlipidemia    Hypertension       Past Surgical History:  Procedure Laterality Date   COLON SURGERY  11/2013   PARTIAL COLECTOMY FOR DIVERTICULITIS   COLONOSCOPY  2014   COLONOSCOPY WITH PROPOFOL N/A 11/29/2016   Procedure: COLONOSCOPY WITH PROPOFOL;  Surgeon: Lollie Sails, MD;  Location: [facility]COPY;  Service: Endoscopy;  Laterality: N/A;   HERNIA REPAIR  04/07/2015   Laparoscopic placement of a 10 x 15 ventral light mesh   INSERTION OF MESH N/A 04/07/2015   Procedure: INSERTION OF MESH;  Surgeon: Robert Bellow, MD;  Location: [facility] ORS;  Service: General;  Laterality: N/A;   JOINT REPLACEMENT     LAPAROSCOPIC SIGMOID COLECTOMY N/A 11/29/2013   Dr Thompson/Procedure: LAPAROSCOPIC ASSISTED SIGMOID COLECTOMY;  Surgeon: Georganna Skeans,  MD;  Location: [place];  Service: General;  Laterality: N/A;   LAPAROSCOPY N/A 04/07/2015   Procedure: LAPAROSCOPY DIAGNOSTIC;  Surgeon: Robert Bellow, MD;  Location: [facility] ORS;  Service: General;  Laterality: N/A;   R shoulder surgery     VENTRAL HERNIA REPAIR N/A 04/07/2015   Procedure: HERNIA REPAIR VENTRAL ADULT;  Surgeon: Robert Bellow, MD;  Location: [facility] ORS;  Service: General;  Laterality: N/A;    Allergies  Allergen Reactions   Atorvastatin Other (See Comments)    Myalgia   Codeine Hives and Rash    No current facility-administered medications for this encounter.   Current Outpatient Medications  Medication Sig Dispense Refill   aspirin EC 81 MG tablet Take 81 mg by mouth daily. Swallow whole.     glipiZIDE (GLUCOTROL XL) 5 MG 24 hr tablet Take 5 mg by mouth daily.     losartan (COZAAR) 25 MG tablet Take 25 mg by mouth daily.     montelukast (SINGULAIR) 10 MG tablet Take 10 mg by mouth daily.     Omega-3 Fatty Acids (FISH OIL) 1000 MG CAPS Take 2 capsules by mouth daily at 8 pm.     omeprazole (PRILOSEC) 20 MG capsule Take 20 mg by mouth daily.     psyllium (METAMUCIL) 58.6 % packet Take 1 packet by mouth daily.     rosuvastatin (CRESTOR) 40 MG tablet Take 1 tablet (40 mg total) by mouth daily. 90 tablet 3   SYNJARDY XR 12.05-998 MG TB24 Take  by mouth daily.      Family History Family History  Problem Relation Age of Onset   Heart disease Mother    Diabetes Mother    Heart Problems Mother    Coronary artery disease Mother        CABG   Heart attack Father 14   Heart Problems Father    Heart failure Father    Bowel Disease Father    Diabetes Father    Fainting Maternal Grandfather    Fainting Paternal Grandmother    Fainting Paternal Grandfather       Social History Social History   Tobacco Use   Smoking status: Never   Smokeless tobacco: Current    Types: Chew  Vaping Use   Vaping Use: Never used  Substance Use Topics   Alcohol use: Yes     Alcohol/week: 4.0 standard drinks of alcohol    Types: 4 Cans of beer per week    Comment: 4 beers/week   Drug use: No        Review of Systems  Constitutional:  Negative for chills and fever.  Eyes: Negative.   Respiratory: Negative.    Cardiovascular: Negative.   Gastrointestinal:  Positive for abdominal pain, heartburn, nausea and vomiting. Negative for blood in stool, constipation and diarrhea.  Genitourinary: Negative.   Skin: Negative.   Neurological: Negative.   Psychiatric/Behavioral: Negative.       Physical Exam Blood pressure 134/71, pulse 83, temperature 97.8 F (36.6 C), temperature source Oral, resp. rate 19, SpO2 98 %.   CONSTITUTIONAL: Well developed, and nourished, appropriately responsive and aware without distress.   EYES: Sclera non-icteric.   EARS, NOSE, MOUTH AND THROAT:  The oropharynx is clear. Oral mucosa is pink and moist.    Hearing is intact to voice.  NECK: Trachea is midline, and there is no jugular venous distension.  LYMPH NODES:  Lymph nodes in the neck are not appreciated. RESPIRATORY:  Normal respiratory effort without pathologic use of accessory muscles. CARDIOVASCULAR:  Well perfused.  GI: The abdomen is soft, subjectively tender in areas, easily distractible without guarding or rebound, and nondistended. There were no palpable masses.  I did not appreciate hepatosplenomegaly.  MUSCULOSKELETAL:  Symmetrical muscle tone appreciated in all four extremities.   No lower extremity edema. SKIN: Skin turgor is normal. No pathologic skin lesions appreciated.  NEUROLOGIC:  Motor and sensation appear grossly normal.  Cranial nerves are grossly without defect. PSYCH:  Alert and oriented to person, place and time. Affect is appropriate for situation.  Data Reviewed I have personally reviewed what is currently available of the patient's imaging, recent labs and medical records.   Labs:     Latest Ref Rng & Units 03/06/2022   11:31 AM 12/01/2013     4:22 AM 11/30/2013    5:36 AM  CBC  WBC 4.0 - 10.5 K/uL 12.7  8.5  6.4   Hemoglobin 13.0 - 17.0 g/dL 17.6  14.6  14.9   Hematocrit 39.0 - 52.0 % 50.9  42.4  42.0   Platelets 150 - 400 K/uL 224  161  159       Latest Ref Rng & Units 03/06/2022   11:31 AM 02/26/2021    9:04 AM 01/08/2021    8:33 AM  CMP  Glucose 70 - 99 mg/dL 99     BUN 6 - 20 mg/dL 14     Creatinine 0.61 - 1.24 mg/dL 0.97     Sodium 135 - 145 mmol/L  139     Potassium 3.5 - 5.1 mmol/L 4.1     Chloride 98 - 111 mmol/L 102     CO2 22 - 32 mmol/L 19     Calcium 8.9 - 10.3 mg/dL 9.7     Total Protein 6.5 - 8.1 g/dL 8.0     Total Bilirubin 0.3 - 1.2 mg/dL 1.4     Alkaline Phos 38 - 126 U/L 59     AST 15 - 41 U/L 25     ALT 0 - 44 U/L 36  65  77       Imaging: Radiological images reviewed:   Within last 24 hrs: CT Abdomen Pelvis W Contrast  Result Date: 03/06/2022 CLINICAL DATA:  Right lower quadrant abdominal pain. EXAM: CT ABDOMEN AND PELVIS WITH CONTRAST TECHNIQUE: Multidetector CT imaging of the abdomen and pelvis was performed using the standard protocol following bolus administration of intravenous contrast. RADIATION DOSE REDUCTION: This exam was performed according to the departmental dose-optimization program which includes automated exposure control, adjustment of the mA and/or kV according to patient size and/or use of iterative reconstruction technique. CONTRAST:  138m OMNIPAQUE IOHEXOL 300 MG/ML  SOLN COMPARISON:  CT abdomen pelvis dated 03/28/2015. FINDINGS: Lower chest: Mild bibasilar atelectasis. Hepatobiliary: No focal liver abnormality is seen. No gallstones, gallbladder wall thickening, or biliary dilatation. Pancreas: Unremarkable. No pancreatic ductal dilatation or surrounding inflammatory changes. Spleen: Normal in size without focal abnormality. Adrenals/Urinary Tract: Adrenal glands are unremarkable. Kidneys are normal, without renal calculi, focal lesion, or hydronephrosis. Bladder is unremarkable.  Stomach/Bowel: Stomach is distended and fluid-filled. There are multiple loops of dilated small bowel in the mid and lower abdomen with fecalization of small bowel contents and a transition point to decompressed small bowel in the right upper quadrant (series 5, image 30). There is a small amount of intraperitoneal fluid in the right lower quadrant, in the pelvis, and trace fluid in the left pericolic gutter. The appendix is not definitely identified. There is colonic diverticulosis without evidence of diverticulitis. A large bowel anastomosis is seen in the left lower quadrant between the descending colon and sigmoid colon. Vascular/Lymphatic: Aortic atherosclerosis. No enlarged abdominal or pelvic lymph nodes. Reproductive: The prostate is mildly enlarged, measuring 5.3 cm in transverse dimension. Other: No abdominal wall hernia. Musculoskeletal: Degenerative changes are seen in the spine. IMPRESSION: 1. Small-bowel obstruction with a transition point in the right upper quadrant. Small volume free intraperitoneal fluid. Aortic Atherosclerosis (ICD10-I70.0). Electronically Signed   By: TZerita BoersM.D.   On: 03/06/2022 12:54    Assessment    Small bowel obstruction, history of IPOM ventral hernia repair, sigmoid colectomy. Patient Active Problem List   Diagnosis Date Noted   Coronary artery disease involving native coronary artery of native heart without angina pectoris 11/29/2020   Bifascicular block 09/27/2020   Family history of ASCVD 09/27/2020   Essential hypertension 09/27/2020   Hyper[place] 2 diabetes mellitus [place] 09/27/2020   Ventral hernia without obstruction or gangrene 03/22/2015   S/P laparoscopic-assisted sigmoidectomy 11/29/2013    Plan    Will admit for NG tube decompression, serial examinations, IV fluid hydration. We discussed the role of conservative management with small bowel obstruction, anticipating improvement in symptoms and potentially  resolution of the obstructive process.  Made patient aware that should this obstructive process not resolve with conservative measures, he may require an operation. Sliding scale insulin coverage for type 2 diabetes, will hold oral meds at this time.  Face-to-face time  spent with the patient and accompanying care providers(if present) was 40 minutes, with more than 50% of the time spent counseling, educating, and coordinating care of the patient.    These notes generated with voice recognition software. I apologize for typographical errors.  [name] M.D., FACS 03/06/2022, 2:13 PM

## 2022-03-07 ENCOUNTER — Inpatient Hospital Stay: Payer: Commercial Managed Care - PPO

## 2022-03-07 DIAGNOSIS — I10 Essential (primary) hypertension: Secondary | ICD-10-CM | POA: Diagnosis not present

## 2022-03-07 DIAGNOSIS — E872 Acidosis, unspecified: Secondary | ICD-10-CM | POA: Diagnosis not present

## 2022-03-07 DIAGNOSIS — E119 Type 2 diabetes mellitus without complications: Secondary | ICD-10-CM

## 2022-03-07 DIAGNOSIS — K56609 Unspecified intestinal obstruction, unspecified as to partial versus complete obstruction: Secondary | ICD-10-CM

## 2022-03-07 DIAGNOSIS — E785 Hyperlipidemia, unspecified: Secondary | ICD-10-CM | POA: Diagnosis not present

## 2022-03-07 DIAGNOSIS — I2510 Atherosclerotic heart disease of native coronary artery without angina pectoris: Secondary | ICD-10-CM | POA: Diagnosis present

## 2022-03-07 LAB — BASIC METABOLIC PANEL
Anion gap: 15 (ref 5–15)
BUN: 20 mg/dL (ref 6–20)
CO2: 13 mmol/L — ABNORMAL LOW (ref 22–32)
Calcium: 8.3 mg/dL — ABNORMAL LOW (ref 8.9–10.3)
Chloride: 109 mmol/L (ref 98–111)
Creatinine, Ser: 0.93 mg/dL (ref 0.61–1.24)
GFR, Estimated: >60 mL/min (ref >60)
Glucose, Bld: 119 mg/dL — ABNORMAL HIGH (ref 70–99)
Potassium: 4.5 mmol/L (ref 3.5–5.1)
Sodium: 137 mmol/L (ref 135–145)

## 2022-03-07 LAB — URINALYSIS, COMPLETE (UACMP) WITH MICROSCOPIC
Bacteria, UA: NONE SEEN
Bilirubin Urine: NEGATIVE
Glucose, UA: >=500 mg/dL — AB
Hgb urine dipstick: NEGATIVE
Ketones, ur: 80 mg/dL — AB
Leukocytes,Ua: NEGATIVE
Nitrite: NEGATIVE
Protein, ur: NEGATIVE mg/dL
Specific Gravity, Urine: 1.024 (ref 1.005–1.030)
Squamous Epithelial / HPF: NONE SEEN /HPF (ref 0–5)
pH: 5 (ref 5.0–8.0)

## 2022-03-07 LAB — CBC
HCT: 47.2 % (ref 39.0–52.0)
Hemoglobin: 16 g/dL (ref 13.0–17.0)
MCH: 30.2 pg (ref 26.0–34.0)
MCHC: 33.9 g/dL (ref 30.0–36.0)
MCV: 89.1 fL (ref 80.0–100.0)
Platelets: 227 10*3/uL (ref 150–400)
RBC: 5.3 MIL/uL (ref 4.22–5.81)
RDW: 12.4 % (ref 11.5–15.5)
WBC: 8.1 10*3/uL (ref 4.0–10.5)
nRBC: 0 % (ref 0.0–0.2)

## 2022-03-07 LAB — POTASSIUM: Potassium: 4 mmol/L (ref 3.5–5.1)

## 2022-03-07 LAB — LACTIC ACID, PLASMA: Lactic Acid, Venous: 1.2 mmol/L (ref 0.5–1.9)

## 2022-03-07 LAB — GLUCOSE, CAPILLARY
Glucose-Capillary: 120 mg/dL — ABNORMAL HIGH (ref 70–99)
Glucose-Capillary: 109 mg/dL — ABNORMAL HIGH (ref 70–99)
Glucose-Capillary: 121 mg/dL — ABNORMAL HIGH (ref 70–99)
Glucose-Capillary: 111 mg/dL — ABNORMAL HIGH (ref 70–99)
Glucose-Capillary: 91 mg/dL (ref 70–99)
Glucose-Capillary: 95 mg/dL (ref 70–99)

## 2022-03-07 LAB — HIV ANTIBODY (ROUTINE TESTING W REFLEX): HIV Screen 4th Generation wRfx: NONREACTIVE

## 2022-03-07 MED ORDER — INSULIN ASPART 100 UNIT/ML IJ SOLN: 0.0000 [IU] | Freq: Every day | INTRAMUSCULAR | Status: DC

## 2022-03-07 MED ORDER — ROSUVASTATIN CALCIUM 10 MG PO TABS
40.0000 mg | ORAL_TABLET | Freq: Every day | ORAL | Status: DC
  Administered 2022-03-07 – 2022-03-08 (×2): 40 mg via ORAL
  Filled 2022-03-07 (×3): qty 4

## 2022-03-07 MED ORDER — INSULIN ASPART 100 UNIT/ML IJ SOLN: 0.0000 [IU] | Freq: Three times a day (TID) | INTRAMUSCULAR | Status: DC

## 2022-03-07 MED ORDER — DIATRIZOATE MEGLUMINE & SODIUM 66-10 % PO SOLN
90.0000 mL | Freq: Once | ORAL | Status: AC
  Administered 2022-03-07: 90 mL via NASOGASTRIC

## 2022-03-07 MED ORDER — LACTATED RINGERS IV BOLUS
1000.0000 mL | Freq: Once | INTRAVENOUS | Status: AC
  Administered 2022-03-07: 1000 mL via INTRAVENOUS

## 2022-03-07 MED ORDER — ACETAMINOPHEN 650 MG RE SUPP: 650.0000 mg | Freq: Four times a day (QID) | RECTAL | Status: DC | PRN

## 2022-03-07 MED ORDER — DEXTROSE-NACL 5-0.9 % IV SOLN: INTRAVENOUS | Status: DC

## 2022-03-07 MED ORDER — HYDRALAZINE HCL 20 MG/ML IJ SOLN: 10.0000 mg | INTRAMUSCULAR | Status: DC | PRN

## 2022-03-07 NOTE — Plan of Care (Signed)
  Problem: Health Behavior/Discharge Planning: Goal: Ability to manage health-related needs will improve Outcome: Progressing   Problem: Education: Goal: Knowledge of General Education information will improve Description: Including pain rating scale, medication(s)/side effects and non-pharmacologic comfort measures Outcome: Progressing   Problem: Clinical Measurements: Goal: Will remain free from infection Outcome: Progressing   Problem: Clinical Measurements: Goal: Respiratory complications will improve 03/07/2022 0715 by Liliane Channel, RN Outcome: Progressing 03/07/2022 0413 by Liliane Channel, RN Outcome: Progressing   Problem: Clinical Measurements: Goal: Cardiovascular complication will be avoided 03/07/2022 0715 by Liliane Channel, RN Outcome: Progressing 03/07/2022 0413 by Liliane Channel, RN Outcome: Progressing   Problem: Activity: Goal: Risk for activity intolerance will decrease Outcome: Progressing   Problem: Pain Managment: Goal: General experience of comfort will improve 03/07/2022 0715 by Liliane Channel, RN Outcome: Progressing 03/07/2022 0413 by Liliane Channel, RN Outcome: Progressing

## 2022-03-07 NOTE — Progress Notes (Signed)
[facility] SURGICAL PROGRESS NOTE (cpt [phone])  Hospital Day(s): 1.   Post op day(s):  Francisco Porter   Interval History: Patient seen and examined, reports resolution/marked improvement of pain.  No acute events or new complaints overnight.  Had significant large volume of feculent NG output.  Reports burping, denies flatus.   Review of Systems:  Constitutional: denies fever, chills  HEENT: denies cough or congestion  Respiratory: denies any shortness of breath  Cardiovascular: denies chest pain or palpitations  Gastrointestinal: denies abdominal pain, N/V, or diarrhea/and bowel function as per interval history Genitourinary: denies burning with urination or urinary frequency Musculoskeletal: denies pain, decreased motor or sensation Integumentary: denies any other rashes or skin discoloration Neurological: denies HA or vision/hearing changes   Vital signs in last 24 hours: [min-max] current  Temp:  [97.8 F (36.6 C)-98.7 F (37.1 C)] 98.5 F (36.9 C) (02/08 0801) Pulse Rate:  [83-118] 117 (02/08 0801) Resp:  [17-19] 17 (02/08 0801) BP: (118-134)/(70-82) 126/71 (02/08 0801) SpO2:  [93 %-99 %] 94 % (02/08 0801) Weight:  [82 kg] 82 kg (02/07 1518)     Height: '5\' 7"'$  (170.2 cm) Weight: 82 kg BMI (Calculated): 28.31   Intake/Output last 2 shifts:  02/07 0701 - 02/08 0700 In: 2620.4 [I.V.:1600.4; NG/GT:20; IV Piggyback:1000] Out: 2495 [Urine:895; Emesis/NG output:1600]   Physical Exam:  Constitutional: alert, cooperative and no distress  HENT: normocephalic without obvious abnormality  Eyes: PERRL, EOM's grossly intact and symmetric  Neuro: CN II - XII grossly intact and symmetric without deficit  Respiratory: breathing non-labored at rest  Cardiovascular: regular rate and sinus rhythm  Gastrointestinal: soft, non-tender, and non-distended Musculoskeletal: UE and LE FROM, no edema or wounds, motor and sensation grossly intact, NT    Labs:     Latest Ref Rng & Units  03/07/2022    5:31 AM 03/06/2022   11:31 AM 12/01/2013    4:22 AM  CBC  WBC 4.0 - 10.5 K/uL 8.1  12.7  8.5   Hemoglobin 13.0 - 17.0 g/dL 16.0  17.6  14.6   Hematocrit 39.0 - 52.0 % 47.2  50.9  42.4   Platelets 150 - 400 K/uL 227  224  161       Latest Ref Rng & Units 03/07/2022    5:31 AM 03/06/2022   11:31 AM 02/26/2021    9:04 AM  CMP  Glucose 70 - 99 mg/dL 119  99    BUN 6 - 20 mg/dL 20  14    Creatinine 0.61 - 1.24 mg/dL 0.93  0.97    Sodium 135 - 145 mmol/L 137  139    Potassium 3.5 - 5.1 mmol/L 4.5  4.1    Chloride 98 - 111 mmol/L 109  102    CO2 22 - 32 mmol/L 13  19    Calcium 8.9 - 10.3 mg/dL 8.3  9.7    Total Protein 6.5 - 8.1 g/dL  8.0    Total Bilirubin 0.3 - 1.2 mg/dL  1.4    Alkaline Phos 38 - 126 U/L  59    AST 15 - 41 U/L  25    ALT 0 - 44 U/L  36  65      Imaging studies: No new pertinent imaging studies   Assessment/Plan:  57 y.o. male with admission for vomiting, with CT scan consistent with small bowel obstruction.   s/p NG tube decompression for same, complicated by pertinent comorbidities including diabetes... Type II on oral meds Patient Active  Problem List   Diagnosis Date Noted   SBO (small bowel obstruction) ([place]) 03/06/2022   Coronary artery disease involving native coronary artery of native heart without angina pectoris 11/29/2020   Bifascicular block 09/27/2020   Family history of ASCVD 09/27/2020   Essential hypertension 09/27/2020   Hyperlipidemia associated with type 2 diabetes mellitus ([place]) 09/27/2020   Ventral hernia without obstruction or gangrene 03/22/2015   S/P laparoscopic-assisted sigmoidectomy 11/29/2013     -KUB today, consider Gastrografin challenge.  -Continue IV fluids and NG tube to low wall suction.  -Expectant return of bowel function. -Continue PPI DVT prophylaxis.  All of the above findings and recommendations were discussed with the patient, patient's family, and the medical team, and all of patient's and family's  questions were answered to his expressed satisfaction.   -- Francisco Porter M.D., [facility] 03/07/2022 9:30 AM

## 2022-03-07 NOTE — Consult Note (Signed)
Medical Consultation   Francisco Porter  JME:[id number]  DOB: [birth year]  DOA: 03/06/2022  PCP: Maryland Pink, MD   Outpatient Specialists:    Requesting physician: -Dr. Milas Gain of surgery  Reason for consultation: -Metabolic acidosis  History of Present Illness: Francisco Porter is an 57 y.o. male with PMH of hypertension, hyperlipidemia, diabetes mellitus, who is admitted due to small bowel obstruction on 03/06/2022.  We are asked to consult due to metabolic acidosis.  After admission, patient has been treated with IV fluid, NG tube.  Patient states that he does not have nausea, vomiting or abdominal pain currently.  Denies chest pain, cough, shortness breath.  Patient has not had bowel movement, but passed gas this morning. Per Dr. Milas Gain, patient was found to have metabolic acidosis today.  BMP showed bicarbonate of 13, anion gap 15, blood sugar 119 today.  Patient is drowsy during the interview, but easily arousable, oriented x 3.  Moves all extremities normally.  Date reviewed, lab, image and vitals: WBC 8.1, GFR> 60, temperature normal, blood pressure 133/80, heart rate 117, RR 18, oxygen saturation 94% on room air.  EKG: I reviewed EKG independently.  EKG done yesterday showed sinus rhythm, QTc 472, bifascicular block.  Review of Systems:   General: no fevers, chills, no changes in body weight. Has fatigue Skin: no rash HEENT: no blurry vision, hearing changes or sore throat Pulm: no dyspnea, coughing, wheezing CV: no chest pain, palpitations, shortness of breath Abd: no nausea/vomiting, abdominal pain, diarrhea/constipation GU: no dysuria, hematuria, polyuria Ext: no arthralgias, myalgias Neuro: no weakness, numbness, or tingling.  Has drowsiness    Past Medical History: Past Medical History:  Diagnosis Date   Diabetes mellitus without complication ([place])    2297   Diverticulitis    Family history of anesthesia complication    his mother has heart mur  mur   Hyperlipidemia    Hypertension     Past Surgical History: Past Surgical History:  Procedure Laterality Date   COLON SURGERY  11/2013   PARTIAL COLECTOMY FOR DIVERTICULITIS   COLONOSCOPY  2014   COLONOSCOPY WITH PROPOFOL N/A 11/29/2016   Procedure: COLONOSCOPY WITH PROPOFOL;  Surgeon: Lollie Sails, MD;  Location: [facility] ENDOSCOPY;  Service: Endoscopy;  Laterality: N/A;   HERNIA REPAIR  04/07/2015   Laparoscopic placement of a 10 x 15 ventral light mesh   INSERTION OF MESH N/A 04/07/2015   Procedure: INSERTION OF MESH;  Surgeon: Robert Bellow, MD;  Location: [facility] ORS;  Service: General;  Laterality: N/A;   JOINT REPLACEMENT     LAPAROSCOPIC SIGMOID COLECTOMY N/A 11/29/2013   Dr Thompson/Procedure: LAPAROSCOPIC ASSISTED SIGMOID COLECTOMY;  Surgeon: Georganna Skeans, MD;  Location: [facility];  Service: General;  Laterality: N/A;   LAPAROSCOPY N/A 04/07/2015   Procedure: LAPAROSCOPY DIAGNOSTIC;  Surgeon: Robert Bellow, MD;  Location: [facility] ORS;  Service: General;  Laterality: N/A;   R shoulder surgery     VENTRAL HERNIA REPAIR N/A 04/07/2015   Procedure: HERNIA REPAIR VENTRAL ADULT;  Surgeon: Robert Bellow, MD;  Location: [facility] ORS;  Service: General;  Laterality: N/A;     Allergies:   Allergies  Allergen Reactions   Atorvastatin Other (See Comments)    Myalgia   Codeine Hives and Rash     Social History:  reports that he has never smoked. His smokeless tobacco use includes chew. He reports current alcohol use of about  4.0 standard drinks of alcohol per week. He reports that he does not use drugs.  Family History: Family History  Problem Relation Age of Onset   Heart disease Mother    Diabetes Mother    Heart Problems Mother    Coronary artery disease Mother        CABG   Heart attack Father 51   Heart Problems Father    Heart failure Father    Bowel Disease Father    Diabetes Father    Fainting Maternal Grandfather    Fainting Paternal Grandmother     Fainting Paternal Grandfather      Physical Exam: Vitals:   03/06/22 2127 03/07/22 0425 03/07/22 0801 03/07/22 1013  BP:  125/70 126/71 133/80  Porter:  (!) 110 (!) 117 (!) 110  Resp: '18  17 18  '$ Temp:  98.4 F (36.9 C) 98.5 F (36.9 C) 98.4 F (36.9 C)  TempSrc:  Oral Oral Oral  SpO2:  95% 94% 95%  Weight:      Height:         General: Not in acute distress HEENT:       Eyes: PERRL, EOMI, no scleral icterus.       ENT: No discharge from the ears and nose, no pharynx injection, no tonsillar enlargement.        Neck: No JVD, no bruit, no mass felt. Heme: No neck lymph node enlargement. Cardiac: S1/S2, RRR, No murmurs, No gallops or rubs. Respiratory: No rales, wheezing, rhonchi or rubs. GI: Soft, nondistended, nontender, no rebound pain, no organomegaly, BS present. Has NG tube in place GU: No hematuria Ext: No pitting leg edema bilaterally. 1+DP/PT Porter bilaterally. Musculoskeletal: No joint deformities, No joint redness or warmth, no limitation of ROM in spin. Skin: No rashes.  Neuro: Drowsy, easily arousable, oriented X3, cranial nerves II-XII grossly intact, moves all extremities normally.  Psych: Patient is not psychotic, no suicidal or hemocidal ideation.    Data reviewed:  I have personally reviewed following labs and imaging studies Labs:  CBC: Recent Labs  Lab 03/06/22 1131 03/07/22 0531  WBC 12.7* 8.1  NEUTROABS 10.9*  --   HGB 17.6* 16.0  HCT 50.9 47.2  MCV 88.5 89.1  PLT 224 357    Basic Metabolic Panel: Recent Labs  Lab 03/06/22 1131 03/07/22 0531  NA 139 137  K 4.1 4.5  CL 102 109  CO2 19* 13*  GLUCOSE 99 119*  BUN 14 20  CREATININE 0.97 0.93  CALCIUM 9.7 8.3*   GFR Estimated Creatinine Clearance: 90.9 mL/min (by C-G formula based on SCr of 0.93 mg/dL). Liver Function Tests: Recent Labs  Lab 03/06/22 1131  AST 25  ALT 36  ALKPHOS 59  BILITOT 1.4*  PROT 8.0  ALBUMIN 4.8   Recent Labs  Lab 03/06/22 1131  LIPASE 33   No  results for input(s): "AMMONIA" in the last 168 hours. Coagulation profile No results for input(s): "INR", "PROTIME" in the last 168 hours.  Cardiac Enzymes: No results for input(s): "CKTOTAL", "CKMB", "CKMBINDEX", "TROPONINI" in the last 168 hours. BNP: Invalid input(s): "POCBNP" CBG: Recent Labs  Lab 03/06/22 2329 03/07/22 0429 03/07/22 0455 03/07/22 0831 03/07/22 1143  GLUCAP 99 120* 109* 121* 111*   D-Dimer No results for input(s): "DDIMER" in the last 72 hours. Hgb A1c Recent Labs    03/06/22 1131  HGBA1C 5.9*   Lipid Profile No results for input(s): "CHOL", "HDL", "LDLCALC", "TRIG", "CHOLHDL", "LDLDIRECT" in the last 72 hours. Thyroid  function studies No results for input(s): "TSH", "T4TOTAL", "T3FREE", "THYROIDAB" in the last 72 hours.  Invalid input(s): "FREET3" Anemia work up No results for input(s): "VITAMINB12", "FOLATE", "FERRITIN", "TIBC", "IRON", "RETICCTPCT" in the last 72 hours. Urinalysis    Component Value Date/Time   COLORURINE YELLOW (A) 03/07/2022 1208   APPEARANCEUR CLEAR (A) 03/07/2022 1208   APPEARANCEUR Clear 05/02/2013 1607   LABSPEC 1.024 03/07/2022 1208   LABSPEC 1.004 05/02/2013 1607   PHURINE 5.0 03/07/2022 1208   GLUCOSEU >=500 (A) 03/07/2022 1208   GLUCOSEU Negative 05/02/2013 1607   HGBUR NEGATIVE 03/07/2022 1208   BILIRUBINUR NEGATIVE 03/07/2022 1208   BILIRUBINUR Negative 05/02/2013 1607   KETONESUR 80 (A) 03/07/2022 1208   PROTEINUR NEGATIVE 03/07/2022 1208   NITRITE NEGATIVE 03/07/2022 1208   LEUKOCYTESUR NEGATIVE 03/07/2022 1208   LEUKOCYTESUR Negative 05/02/2013 1607     Microbiology No results found for this or any previous visit (from the past 240 hour(s)).     Inpatient Medications:   Scheduled Meds:  diatrizoate meglumine-sodium  90 mL Per NG tube Once   heparin  5,000 Units Subcutaneous Q8H   insulin aspart  0-5 Units Subcutaneous QHS   insulin aspart  0-9 Units Subcutaneous TID WC   losartan  25 mg Oral  Daily   montelukast  10 mg Oral QHS   pantoprazole (PROTONIX) IV  40 mg Intravenous QHS   rosuvastatin  40 mg Oral Daily   Continuous Infusions:  dextrose 5 % and 0.9% NaCl 75 mL/hr at 03/07/22 1200     Radiological Exams on Admission: DG Abd 1 View  Result Date: 03/07/2022 CLINICAL DATA:  Small bowel obstruction. EXAM: ABDOMEN - 1 VIEW COMPARISON:  March 06, 2022. FINDINGS: The bowel gas pattern is normal. No radio-opaque calculi or other significant radiographic abnormality are seen. IMPRESSION: No abnormal bowel dilatation. Electronically Signed   By: Marijo Conception M.D.   On: 03/07/2022 10:48   DG Abd 1 View  Result Date: 03/06/2022 CLINICAL DATA:  NG tube placement EXAM: ABDOMEN - 1 VIEW COMPARISON:  None Available. FINDINGS: NG tube tip and side port are in the stomach. Visualized bowel gas pattern is normal. Nodular opacity of the lower right lung. IMPRESSION: 1. NG tube tip and side port are in the stomach 2. Nodular opacity of the lower right lung, possibly artifactual. Recommend dedicated chest x-ray to assess for persistence. Electronically Signed   By: Yetta Glassman M.D.   On: 03/06/2022 17:03   CT Abdomen Pelvis W Contrast  Result Date: 03/06/2022 CLINICAL DATA:  Right lower quadrant abdominal pain. EXAM: CT ABDOMEN AND PELVIS WITH CONTRAST TECHNIQUE: Multidetector CT imaging of the abdomen and pelvis was performed using the standard protocol following bolus administration of intravenous contrast. RADIATION DOSE REDUCTION: This exam was performed according to the departmental dose-optimization program which includes automated exposure control, adjustment of the mA and/or kV according to patient size and/or use of iterative reconstruction technique. CONTRAST:  137m OMNIPAQUE IOHEXOL 300 MG/ML  SOLN COMPARISON:  CT abdomen pelvis dated 03/28/2015. FINDINGS: Lower chest: Mild bibasilar atelectasis. Hepatobiliary: No focal liver abnormality is seen. No gallstones, gallbladder wall  thickening, or biliary dilatation. Pancreas: Unremarkable. No pancreatic ductal dilatation or surrounding inflammatory changes. Spleen: Normal in size without focal abnormality. Adrenals/Urinary Tract: Adrenal glands are unremarkable. Kidneys are normal, without renal calculi, focal lesion, or hydronephrosis. Bladder is unremarkable. Stomach/Bowel: Stomach is distended and fluid-filled. There are multiple loops of dilated small bowel in the mid and lower abdomen with fecalization  of small bowel contents and a transition point to decompressed small bowel in the right upper quadrant (series 5, image 30). There is a small amount of intraperitoneal fluid in the right lower quadrant, in the pelvis, and trace fluid in the left pericolic gutter. The appendix is not definitely identified. There is colonic diverticulosis without evidence of diverticulitis. A large bowel anastomosis is seen in the left lower quadrant between the descending colon and sigmoid colon. Vascular/Lymphatic: Aortic atherosclerosis. No enlarged abdominal or pelvic lymph nodes. Reproductive: The prostate is mildly enlarged, measuring 5.3 cm in transverse dimension. Other: No abdominal wall hernia. Musculoskeletal: Degenerative changes are seen in the spine. IMPRESSION: 1. Small-bowel obstruction with a transition point in the right upper quadrant. Small volume free intraperitoneal fluid. Aortic Atherosclerosis (ICD10-I70.0). Electronically Signed   By: Zerita Boers M.D.   On: 03/06/2022 12:54    Impression/Recommendations Principal Problem:   SBO (small bowel obstruction) (HCC) Active Problems:   Metabolic acidosis   Essential hypertension   HLD (hyperlipidemia)   CAD (coronary artery disease)   Diabetes mellitus without complication (HCC)    Assessment and Plan:  SBO (small bowel obstruction) ([place]): pt seems to have responded to IVF and NG tube treatment.  Currently denies nausea, vomiting or abdominal pain.  -Continue IV  fluids -Continue NG tube -As needed Zofran -will d/c prn Benadryl since patient is drowsy -Pain control per surgery team  Metabolic acidosis: Better prominent 13.  Anion gap 15, blood sugar 119, less likely to have DKA.  Possibly due to starvation. -will start D-5-NS at 2 cc/h -check K level -check lactic acid level --> 1.2 -check UA for ketone --> negative UA for UTI, with ketone 80. -will 1L of LR bolus  Essential hypertension -IV hydralazine as needed -Cozaar  HLD (hyperlipidemia) -Crestor  CAD (coronary artery disease) -Crestor  Diabetes mellitus without complication ([place]): Recent A1c 5.9, well-controlled.  Patient taking glipizide and Synjardy -Sliding scale insulin    Thank you for this consultation.  Our [facility] hospitalist team will follow the patient with you.  Time Spent: 35 min     Ivor Costa M.D. [facility] 03/07/2022, 1:57 PM

## 2022-03-08 LAB — BASIC METABOLIC PANEL
Anion gap: 8 (ref 5–15)
BUN: 13 mg/dL (ref 6–20)
CO2: 20 mmol/L — ABNORMAL LOW (ref 22–32)
Calcium: 8 mg/dL — ABNORMAL LOW (ref 8.9–10.3)
Chloride: 108 mmol/L (ref 98–111)
Creatinine, Ser: 0.66 mg/dL (ref 0.61–1.24)
GFR, Estimated: >60 mL/min (ref >60)
Glucose, Bld: 98 mg/dL (ref 70–99)
Potassium: 3.5 mmol/L (ref 3.5–5.1)
Sodium: 136 mmol/L (ref 135–145)

## 2022-03-08 LAB — PHOSPHORUS: Phosphorus: 1.7 mg/dL — ABNORMAL LOW (ref 2.5–4.6)

## 2022-03-08 LAB — MAGNESIUM: Magnesium: 2.3 mg/dL (ref 1.7–2.4)

## 2022-03-08 LAB — GLUCOSE, CAPILLARY
Glucose-Capillary: 88 mg/dL (ref 70–99)
Glucose-Capillary: 117 mg/dL — ABNORMAL HIGH (ref 70–99)

## 2022-03-08 MED ORDER — POTASSIUM PHOSPHATES 15 MMOLE/5ML IV SOLN
30.0000 mmol | Freq: Once | INTRAVENOUS | Status: DC
  Filled 2022-03-08: qty 10

## 2022-03-08 MED ORDER — HYDROCODONE-ACETAMINOPHEN 5-325 MG PO TABS: 1.0000 | ORAL_TABLET | Freq: Four times a day (QID) | ORAL | 0 refills | Status: DC | PRN

## 2022-03-08 NOTE — Discharge Summary (Signed)
Physician Discharge Summary  Patient ID: Francisco Porter MRN: [id number] DOB/AGE: 57/57/1967 57 y.o.  Admit date: 03/06/2022 Discharge date: 03/08/2022  Admission Diagnoses:   Principal Problem:   SBO (small bowel obstruction) ([place]) Active Problems:   Essential hypertension   HLD (hyperlipidemia)   CAD (coronary artery disease)   Metabolic acidosis   Diabetes mellitus without complication ([place])  Discharge Diagnoses:  Principal Problem:   SBO (small bowel obstruction) ([place]) Active Problems:   Essential hypertension   HLD (hyperlipidemia)   CAD (coronary artery disease)   Metabolic acidosis   Diabetes mellitus without complication ([place])   Discharged Condition: good  Hospital Course: Place NGT on admission and obtained 1300 mL of output with immediate relief.  Improved gas pattern on f/u KUB, Gastrograffin challenge passed thru to colon, confirming no complete SBO.  Tolerated CLD, and carb modified diet without c/o pain with restoration of bowel function.   Consults:  Hospitalist for metabolic acidosis in diabetic.   Significant Diagnostic Studies: radiology: X-Ray: see reports and CT scan: see report.   Treatments: IV hydration and bowel rest with NGT decompression.   Discharge Exam: Blood pressure (!) 145/91, pulse 99, temperature 97.9 F (36.6 C), resp. rate 18, height 5' 7"$  (1.702 m), weight 82 kg, SpO2 97 %. GI: soft, non-tender; bowel sounds normal; no masses,  no organomegaly  Disposition: Discharge disposition: 01-Home or Self Care       Discharge Instructions     Call MD for:  persistant nausea and vomiting   Complete by: As directed    Call MD for:  severe uncontrolled pain   Complete by: As directed    Diet Carb Modified   Complete by: As directed    Increase activity slowly   Complete by: As directed       Allergies as of 03/08/2022       Reactions   Atorvastatin Other (See Comments)   Myalgia   Codeine Hives, Rash        Medication List      TAKE these medications    aspirin EC 81 MG tablet Take 81 mg by mouth daily. Swallow whole.   Fish Oil 1000 MG Caps Take 2 capsules by mouth daily at 8 pm.   glipiZIDE 5 MG 24 hr tablet Commonly known as: GLUCOTROL XL Take 5 mg by mouth daily.   HYDROcodone-acetaminophen 5-325 MG tablet Commonly known as: NORCO/VICODIN Take 1 tablet by mouth every 6 (six) hours as needed for moderate pain.   losartan 25 MG tablet Commonly known as: COZAAR Take 25 mg by mouth daily.   montelukast 10 MG tablet Commonly known as: SINGULAIR Take 10 mg by mouth daily.   omeprazole 20 MG capsule Commonly known as: PRILOSEC Take 20 mg by mouth daily.   psyllium 58.6 % packet Commonly known as: METAMUCIL Take 1 packet by mouth daily.   rosuvastatin 40 MG tablet Commonly known as: CRESTOR Take 1 tablet (40 mg total) by mouth daily.   Semaglutide(0.25 or 0.5MG/DOS) 2 MG/3ML Sopn Inject 0.25 mg into the skin once a week. Monday   Synjardy XR 12.05-998 MG Tb24 Generic drug: Empagliflozin-metFORMIN HCl ER Take 2 tablets by mouth daily.   tadalafil 20 MG tablet Commonly known as: CIALIS Take 20 mg by mouth daily as needed for erectile dysfunction.        Follow-up Information     [name], MD Follow up.   Specialty: General Surgery Why: As needed, If symptoms worsen Contact information: [phone] [place] [address] [phone]                 Signed: [name], M.D., [facility] 03/08/2022, 2:47 PM

## 2022-03-08 NOTE — Progress Notes (Signed)
Discharge instructions reviewed with patient including followup visits and new medications.  Understanding was verbalized and all questions were answered.  IV removed without complication; patient tolerated well.  Patient discharged home via wheelchair in stable condition escorted by volunteer staff.

## 2022-03-08 NOTE — Progress Notes (Signed)
[facility] Progress Note  Patient: Francisco Porter    [id number]  DOA: 03/06/2022     Date of Service: the patient was seen and examined on 03/08/2022  Chief Complaint  Patient presents with   Abdominal Pain   Brief hospital course: Francisco Porter is an 57 y.o. male with PMH of hypertension, hyperlipidemia, diabetes mellitus, who is admitted due to small bowel obstruction on 03/06/2022.  We are asked to consult due to metabolic acidosis.   After admission, patient has been treated with IV fluid, NG tube.  Patient states that he does not have nausea, vomiting or abdominal pain currently.  Denies chest pain, cough, shortness breath.  Patient has not had bowel movement, but passed gas this morning. Per Dr. Milas Gain, patient was found to have metabolic acidosis today.  BMP showed bicarbonate of 13, anion gap 15, blood sugar 119 today.  Patient is drowsy during the interview, but easily arousable, oriented x 3.  Moves all extremities normally.    Assessment and Plan: BO (small bowel obstruction) ([place]):  S/p NG tube and IV fluid, SBO resolved, diet has been resumed by primary team General surgery. Continue symptomatic treatment Further management as per primary team    Metabolic acidosis, most likely due to starvation, less likely DKA. Anion gap 8 within normal range, bicarb 20, improved. S/p 1 L LR bolus, D5 NS was given last night Continue diet, repeat blood work tomorrow a.m.  Hypophosphatemia due to nutritional deficiency, Phos repleted IV. Monitor and replete as needed.   Essential hypertension -IV hydralazine as needed -Cozaar   HLD (hyperlipidemia) -Crestor   CAD (coronary artery disease) -Crestor   Diabetes mellitus without complication ([place]): Recent A1c 5.9, well-controlled.  Patient taking glipizide and Synjardy -Sliding scale insulin   Body mass index is 28.31 kg/m.  Interventions:       Diet: Carb modified diet DVT Prophylaxis: Subcutaneous Heparin     Advance goals of care discussion: Full code  Family Communication: family was present at bedside, at the time of interview.  The pt provided permission to discuss medical plan with the family. Opportunity was given to ask question and all questions were answered satisfactorily.   Disposition:  Pt is from Home, admitted with SBO under general surgery under general surgery, SBO resolved, diet has been resumed, patient can be discharged home when cleared by general surgery. Discharge to Home by primary team   Subjective: No significant events overnight, patient denies any abdominal pain, no nausea vomiting.  Patient is passing gas and did move bowels couple of times yesterday.  Diet has been advanced by general surgery.  Physical Exam: General: NAD, lying comfortably Appear in no distress, affect appropriate Eyes: PERRLA ENT: Oral Mucosa Clear, moist  Neck: no JVD,  Cardiovascular: S1 and S2 Present, no Murmur,  Respiratory: good respiratory effort, Bilateral Air entry equal and Decreased, no Crackles, no wheezes Abdomen: Bowel Sound present, Soft and no tenderness,  Skin: no rashes Extremities: no Pedal edema, no calf tenderness Neurologic: without any new focal findings Gait not checked due to patient safety concerns  Vitals:   03/07/22 1013 03/07/22 1646 03/07/22 2222 03/08/22 0611  BP: 133/80 133/79 130/78 (!) 145/91  Pulse: (!) 110 (!) 104 (!) 101 99  Resp: 18 18 19 18  $ Temp: 98.4 F (36.9 C) 98.1 F (36.7 C) 98.5 F (36.9 C) 97.9 F (36.6 C)  TempSrc: Oral Oral Oral   SpO2: 95% 98% 96% 97%  Weight:  Height:        Intake/Output Summary (Last 24 hours) at 03/08/2022 1420 Last data filed at 03/08/2022 1255 Gross per 24 hour  Intake 2897.82 ml  Output 500 ml  Net 2397.82 ml   Filed Weights   03/06/22 1518  Weight: 82 kg    Data Reviewed: I have personally reviewed and interpreted daily labs, tele strips, imagings as discussed above. I reviewed all nursing  notes, pharmacy notes, vitals, pertinent old records I have discussed plan of care as described above with RN and patient/family.  CBC: Recent Labs  Lab 03/06/22 1131 03/07/22 0531  WBC 12.7* 8.1  NEUTROABS 10.9*  --   HGB 17.6* 16.0  HCT 50.9 47.2  MCV 88.5 89.1  PLT 224 [id number]   Basic Metabolic Panel: Recent Labs  Lab 03/06/22 1131 03/07/22 0531 03/07/22 1543 03/08/22 0232 03/08/22 0235  NA 139 137  --   --  136  K 4.1 4.5 4.0  --  3.5  CL 102 109  --   --  108  CO2 19* 13*  --   --  20*  GLUCOSE 99 119*  --   --  98  BUN 14 20  --   --  13  CREATININE 0.97 0.93  --   --  0.66  CALCIUM 9.7 8.3*  --   --  8.0*  MG  --   --   --  2.3  --   PHOS  --   --   --  1.7*  --     Studies: DG Abd Portable 1V-Small Bowel Obstruction Protocol-initial, 8 hr delay  Result Date: 03/07/2022 CLINICAL DATA:  Small-bowel obstruction. EXAM: PORTABLE ABDOMEN - 1 VIEW COMPARISON:  Abdominal radiograph dated 03/07/2022 and CT dated 03/06/2022. FINDINGS: Enteric tube with tip in the proximal body of the stomach. Oral contrast has traversed into the colon and rectum. There is diffuse colonic diverticulosis. There is persistent dilatation of small-bowel loops measuring up to 4 cm. The passage of contrast into the colon is suggestive of and ileus versus a low-grade or partial obstruction. Clinical correlation is recommended. IMPRESSION: 1. Oral contrast has traversed into the colon. 2. Persistent dilatation of small-bowel loops. Electronically Signed   By: Anner Crete M.D.   On: 03/07/2022 23:12   DG Abd Portable 1V-Small Bowel Protocol-Position Verification  Result Date: 03/07/2022 CLINICAL DATA:  Nasogastric tube placement EXAM: PORTABLE ABDOMEN - 1 VIEW COMPARISON:  03/07/2022 10:28 a.m. FINDINGS: Nasogastric tube tip and side port are in the stomach body, satisfactorily positioned. The lung bases appear clear. Scattered gas and upper normal sized loops of small bowel. IMPRESSION: 1. Nasogastric  tube tip and side port are in the stomach body, satisfactorily positioned. Electronically Signed   By: Van Clines M.D.   On: 03/07/2022 14:50    Scheduled Meds:  heparin  5,000 Units Subcutaneous Q8H   insulin aspart  0-5 Units Subcutaneous QHS   insulin aspart  0-9 Units Subcutaneous TID WC   losartan  25 mg Oral Daily   montelukast  10 mg Oral QHS   pantoprazole (PROTONIX) IV  40 mg Intravenous QHS   rosuvastatin  40 mg Oral Daily   Continuous Infusions: PRN Meds: acetaminophen, hydrALAZINE, HYDROmorphone (DILAUDID) injection, ondansetron **OR** ondansetron (ZOFRAN) IV, phenol  Time spent: 35 minutes  Author: Val Riles. MD [facility] 03/08/2022 2:20 PM  To reach On-call, see care teams to locate the attending and reach out to them via [email or web address]. If 7PM-7AM, please  contact night-coverage If you still have difficulty reaching the attending provider, please page the [facility] (Director on Call) for [facility] on amion for assistance.

## 2022-03-08 NOTE — TOC CM/SW Note (Signed)
  Transition of Care [facility]) Screening Note   Patient Details  Name: Francisco Porter Date of Birth: 02-Jun-1965   Transition of Care [facility]) CM/SW Contact:    Candie Chroman, LCSW Phone Number: 03/08/2022, 9:33 AM    Transition of Care Department [facility]) has reviewed patient and no TOC needs have been identified at this time. We will continue to monitor patient advancement through interdisciplinary progression rounds. If new patient transition needs arise, please place a TOC consult.

## 2022-03-13 ENCOUNTER — Other Ambulatory Visit: Payer: Self-pay | Admitting: Internal Medicine

## 2022-07-28 ENCOUNTER — Other Ambulatory Visit: Payer: Self-pay | Admitting: Internal Medicine

## 2022-07-29 NOTE — Telephone Encounter (Signed)
Please schedule overdue F/U appointment for further refills. Thank you! 

## 2022-07-30 NOTE — Telephone Encounter (Signed)
APPT ON 08/07/22

## 2022-08-07 ENCOUNTER — Encounter: Payer: Self-pay | Admitting: Medical

## 2022-08-07 ENCOUNTER — Ambulatory Visit: Payer: Commercial Managed Care - PPO | Attending: Medical | Admitting: Medical

## 2022-08-07 VITALS — BP 120/74 | HR 81 | Ht 67.0 in | Wt 180.4 lb

## 2022-08-07 DIAGNOSIS — E782 Mixed hyperlipidemia: Secondary | ICD-10-CM | POA: Diagnosis not present

## 2022-08-07 DIAGNOSIS — I10 Essential (primary) hypertension: Secondary | ICD-10-CM | POA: Diagnosis not present

## 2022-08-07 DIAGNOSIS — I452 Bifascicular block: Secondary | ICD-10-CM

## 2022-08-07 DIAGNOSIS — E1169 Type 2 diabetes mellitus with other specified complication: Secondary | ICD-10-CM

## 2022-08-07 DIAGNOSIS — I2510 Atherosclerotic heart disease of native coronary artery without angina pectoris: Secondary | ICD-10-CM | POA: Diagnosis not present

## 2022-08-07 DIAGNOSIS — Z7984 Long term (current) use of oral hypoglycemic drugs: Secondary | ICD-10-CM

## 2022-08-07 DIAGNOSIS — E785 Hyperlipidemia, unspecified: Secondary | ICD-10-CM

## 2022-08-07 NOTE — Patient Instructions (Signed)
Medication Instructions:  No changes *If you need a refill on your cardiac medications before your next appointment, please call your pharmacy*   Lab Work: None ordered If you have labs (blood work) drawn today and your tests are completely normal, you will receive your results only by: MyChart Message (if you have MyChart) OR A paper copy in the mail If you have any lab test that is abnormal or we need to change your treatment, we will call you to review the results.   Testing/Procedures: None ordered   Follow-Up: At [facility], you and your health needs are our priority.  As part of our continuing mission to provide you with exceptional heart care, we have created designated Provider Care Teams.  These Care Teams include your primary Cardiologist (physician) and Advanced Practice Providers (APPs -  Physician Assistants and Nurse Practitioners) who all work together to provide you with the care you need, when you need it.  We recommend signing up for the patient portal called "MyChart".  Sign up information is provided on this After Visit Summary.  MyChart is used to connect with patients for Virtual Visits (Telemedicine).  Patients are able to view lab/test results, encounter notes, upcoming appointments, etc.  Non-urgent messages can be sent to your provider as well.   To learn more about what you can do with MyChart, go to [email or web address].    Your next appointment:   12 month(s)  Provider:   You may see Christopher End, MD or one of the following Advanced Practice Providers on your designated Care Team:   Christopher Berge, NP Ryan Dunn, PA-C Cadence Furth, PA-C Sheri Hammock, NP    

## 2022-08-07 NOTE — Progress Notes (Signed)
Cardiology Office Note:    Date:  08/07/2022   ID:  Francisco Porter, DOB 1965/12/10, MRN [id number]  PCP:  Jerl Mina, MD  [facility] Cardiologist:  Yvonne Kendall, MD  [facility] Electrophysiologist:  None   Referring MD: Jerl Mina, MD   Chief Complaint: 1 year follow-up  History of Present Illness:    Francisco Porter is a 57 y.o. male with a hx of CAD, bifasicular block, HTN, HLD, and DM2 who presents for 1 year follow-up.    In August 2022 the patient was noted to have an abnormal EKG. Echo showed mild LVH and aortic valve sclerosis. Coronary CTA showed mild plaquing of the LAD and Lcx with a coronary calcium score of 184.   Patient was last seen 06/2021 and was stable from a cardiac perspective.   Today, the patient reports he is overall doing well. He lost about 25lbs with ozempic. He does no formal activity. He may need right knee surgery later this year. He denies chest pain, shortness of breath, lower leg edema, orthopnea, pnd, lightheadedness or dizziness. Most recent A1C 6.3.   Past Medical History:  Diagnosis Date   Diabetes mellitus without complication (HCC)    2002   Diverticulitis    Family history of anesthesia complication    his mother has heart mur mur   Hyperlipidemia    Hypertension     Past Surgical History:  Procedure Laterality Date   COLON SURGERY  11/2013   PARTIAL COLECTOMY FOR DIVERTICULITIS   COLONOSCOPY  2014   COLONOSCOPY WITH PROPOFOL N/A 11/29/2016   Procedure: COLONOSCOPY WITH PROPOFOL;  Surgeon: Christena Deem, MD;  Location[facility] ENDOSCOPY;  Service: Endoscopy;  Laterality: N/A;   HERNIA REPAIR  04/07/2015   Laparoscopic placement of a 10 x 15 ventral light mesh   INSERTION OF MESH N/A 04/07/2015   Procedure: INSERTION OF MESH;  Surgeon: Earline Mayotte, MD;  Location: [facility] ORS;  Service: General;  Laterality: N/A;   JOINT REPLACEMENT     LAPAROSCOPIC SIGMOID COLECTOMY N/A 11/29/2013   Dr Thompson/Procedure: LAPAROSCOPIC  ASSISTED SIGMOID COLECTOMY;  Surgeon: Violeta Gelinas, MD;  Location: [facility] OR;  Service: General;  Laterality: N/A;   LAPAROSCOPY N/A 04/07/2015   Procedure: LAPAROSCOPY DIAGNOSTIC;  Surgeon: Earline Mayotte, MD;  Location: [facility] ORS;  Service: General;  Laterality: N/A;   R shoulder surgery     VENTRAL HERNIA REPAIR N/A 04/07/2015   Procedure: HERNIA REPAIR VENTRAL ADULT;  Surgeon: Earline Mayotte, MD;  Location: [facility] ORS;  Service: General;  Laterality: N/A;    Current Medications: Current Meds  Medication Sig   aspirin EC 81 MG tablet Take 81 mg by mouth daily. Swallow whole.   losartan (COZAAR) 25 MG tablet Take 25 mg by mouth daily.   montelukast (SINGULAIR) 10 MG tablet Take 10 mg by mouth daily.   Omega-3 Fatty Acids (FISH OIL) 1000 MG CAPS Take 2 capsules by mouth daily at 8 pm.   omeprazole (PRILOSEC) 20 MG capsule Take 20 mg by mouth daily.   rosuvastatin (CRESTOR) 40 MG tablet TAKE 1 TABLET BY MOUTH EVERY DAY   Semaglutide,0.25 or 0.5MG /DOS, 2 MG/3ML SOPN Inject 0.25 mg into the skin once a week. Monday   SYNJARDY XR 12.05-998 MG TB24 Take 2 tablets by mouth daily.   tadalafil (CIALIS) 20 MG tablet Take 20 mg by mouth daily as needed for erectile dysfunction.     Allergies:   Atorvastatin and Codeine   Social  History   Socioeconomic History   Marital status: Legally Separated    Spouse name: Not on file   Number of children: Not on file   Years of education: Not on file   Highest education level: Not on file  Occupational History   Not on file  Tobacco Use   Smoking status: Never   Smokeless tobacco: Current    Types: Chew  Vaping Use   Vaping Use: Never used  Substance and Sexual Activity   Alcohol use: Yes    Alcohol/week: 4.0 standard drinks of alcohol    Types: 4 Cans of beer per week    Comment: 4 beers/week   Drug use: No   Sexual activity: Yes    Birth control/protection: None  Other Topics Concern   Not on file  Social History Narrative   Not on  file   Social Determinants of Health   Financial Resource Strain: Not on file  Food Insecurity: No Food Insecurity (03/06/2022)   Hunger Vital Sign    Worried About Running Out of Food in the Last Year: Never true    Ran Out of Food in the Last Year: Never true  Transportation Needs: No Transportation Needs (03/06/2022)   PRAPARE - Administrator, Civil Service (Medical): No    Lack of Transportation (Non-Medical): No  Physical Activity: Not on file  Stress: Not on file  Social Connections: Not on file     Family History: The patient's family history includes Bowel Disease in his father; Coronary artery disease in his mother; Diabetes in his father and mother; Fainting in his maternal grandfather, paternal grandfather, and paternal grandmother; Heart Problems in his father and mother; Heart attack (age of onset: 60) in his father; Heart disease in his mother; Heart failure in his father.  ROS:   Please see the history of present illness.     All other systems reviewed and are negative.  EKGs/Labs/Other Studies Reviewed:    The following studies were reviewed today:  Cardiac CTA 09/2020  IMPRESSION: 1. Coronary calcium score of 184. This was 87th percentile for age and sex matched control.   2. Normal coronary origin with left dominance.   3. Minimal stenosis (<25%), involving the LAD and LCx arteries.   4. CAD-RADS 1. Minimal non-obstructive CAD (0-24%). Consider non-atherosclerotic causes of chest pain. Consider preventive therapy and risk factor modification.   Electronically Signed: By: Debbe Odea M.D. On: 10/26/2020 13:56  Echo 09/2020 1. Left ventricular ejection fraction, by estimation, is 60 to 65%. The  left ventricle has normal function. The left ventricle has no regional  wall motion abnormalities. There is mild left ventricular hypertrophy.  Left ventricular diastolic parameters  were normal.   2. Right ventricular systolic function is  normal. The right ventricular  size is normal. Tricuspid regurgitation signal is inadequate for assessing  PA pressure.   3. The mitral valve is normal in structure. No evidence of mitral valve  regurgitation. No evidence of mitral stenosis.   4. The aortic valve was not well visualized but valve opening and  calcifications are highly suggestive of bicuspid valve. Aortic valve  regurgitation is not visualized. Mild to moderate aortic valve  sclerosis/calcification is present, without any  evidence of aortic stenosis. Aortic valve area, by VTI measures 2.44 cm.  Aortic valve mean gradient measures 10.0 mmHg.   5. Aortic dilatation noted. There is borderline dilatation of the aortic  root, measuring 38 mm.   6. The  inferior vena cava is normal in size with greater than 50%  respiratory variability, suggesting right atrial pressure of 3 mmHg.   EKG:  EKG is ordered today.  The ekg ordered today demonstrates NSR, 81bpm LAD, TWI III aVF  Recent Labs: 03/06/2022: ALT 36 03/07/2022: Hemoglobin 16.0; Platelets 227 03/08/2022: BUN 13; Creatinine, Ser 0.66; Magnesium 2.3; Potassium 3.5; Sodium 136  Recent Lipid Panel    Component Value Date/Time   CHOL 157 02/26/2021 0904   TRIG 147 02/26/2021 0904   HDL 33 (L) 02/26/2021 0904   CHOLHDL 4.8 02/26/2021 0904   LDLCALC 98 02/26/2021 0904    Physical Exam:    VS:  BP 120/74 (BP Location: Left Arm, Patient Position: Sitting, Cuff Size: Large)   Pulse 81   Ht 5\' 7"  (1.702 m)   Wt 180 lb 6.4 oz (81.8 kg)   SpO2 98%   BMI 28.25 kg/m     Wt Readings from Last 3 Encounters:  08/07/22 180 lb 6.4 oz (81.8 kg)  03/06/22 180 lb 12.4 oz (82 kg)  06/29/21 200 lb 6.4 oz (90.9 kg)     GEN: Well nourished, well developed in no acute distress HEENT: Normal NECK: No JVD; No carotid bruits LYMPHATICS: No lymphadenopathy CARDIAC: RRR, no murmurs, rubs, gallops RESPIRATORY:  Clear to auscultation without rales, wheezing or rhonchi  ABDOMEN: Soft,  non-tender, non-distended MUSCULOSKELETAL:  No edema; No deformity  SKIN: Warm and dry NEUROLOGIC:  Alert and oriented x 3 PSYCHIATRIC:  Normal affect   ASSESSMENT:    1. Coronary artery disease involving native coronary artery of native heart without angina pectoris   2. Bifascicular block   3. Hyperlipidemia, mixed   4. Essential hypertension   5. Hyperlipidemia associated with type 2 diabetes mellitus (HCC)    PLAN:    In order of problems listed above:  Nonobstructive CAD Cardiac CTA 09/2020 showed non-obstructive CAD. He denies anginal symptoms. Continue Aspirin and statin therapy. No further ischemic work-up indicated at this time.   Bifascicular block EKG shows no high grade heart block. No signs or symptoms of heart block.   HLD LDL 59. Continue Crestor 40mg  daily.   HTN BP is normal today.   DM2 Most recent A1C 6.3. He lost about 25lbs on Ozempic over the last year.   Disposition: Follow up in 1 year(s) with MD     Signed, Amira Podolak David Stall, PA-C  08/07/2022 11:05 AM    [facility]

## 2022-09-29 ENCOUNTER — Other Ambulatory Visit: Payer: Self-pay | Admitting: Internal Medicine

## 2023-04-14 ENCOUNTER — Other Ambulatory Visit: Payer: Self-pay | Admitting: Internal Medicine

## 2023-06-12 ENCOUNTER — Other Ambulatory Visit: Payer: Self-pay | Admitting: Internal Medicine

## 2023-06-26 ENCOUNTER — Inpatient Hospital Stay
Admission: EM | Admit: 2023-06-26 | Discharge: 2023-06-28 | DRG: 389 | Disposition: A | Discharge status: Home / Self Care | Attending: Internal Medicine | Admitting: Internal Medicine

## 2023-06-26 ENCOUNTER — Emergency Department

## 2023-06-26 ENCOUNTER — Observation Stay

## 2023-06-26 ENCOUNTER — Other Ambulatory Visit: Payer: Self-pay

## 2023-06-26 DIAGNOSIS — Z7982 Long term (current) use of aspirin: Secondary | ICD-10-CM

## 2023-06-26 DIAGNOSIS — E119 Type 2 diabetes mellitus without complications: Secondary | ICD-10-CM

## 2023-06-26 DIAGNOSIS — K56609 Unspecified intestinal obstruction, unspecified as to partial versus complete obstruction: Principal | ICD-10-CM | POA: Diagnosis present

## 2023-06-26 DIAGNOSIS — Z8249 Family history of ischemic heart disease and other diseases of the circulatory system: Secondary | ICD-10-CM

## 2023-06-26 DIAGNOSIS — Z79899 Other long term (current) drug therapy: Secondary | ICD-10-CM

## 2023-06-26 DIAGNOSIS — K565 Intestinal adhesions [bands], unspecified as to partial versus complete obstruction: Secondary | ICD-10-CM | POA: Diagnosis not present

## 2023-06-26 DIAGNOSIS — I10 Essential (primary) hypertension: Secondary | ICD-10-CM | POA: Diagnosis present

## 2023-06-26 DIAGNOSIS — E1169 Type 2 diabetes mellitus with other specified complication: Secondary | ICD-10-CM | POA: Diagnosis present

## 2023-06-26 DIAGNOSIS — Z9049 Acquired absence of other specified parts of digestive tract: Secondary | ICD-10-CM

## 2023-06-26 DIAGNOSIS — Z885 Allergy status to narcotic agent status: Secondary | ICD-10-CM

## 2023-06-26 DIAGNOSIS — Z720 Tobacco use: Secondary | ICD-10-CM

## 2023-06-26 DIAGNOSIS — E785 Hyperlipidemia, unspecified: Secondary | ICD-10-CM | POA: Diagnosis present

## 2023-06-26 DIAGNOSIS — Z7985 Long-term (current) use of injectable non-insulin antidiabetic drugs: Secondary | ICD-10-CM

## 2023-06-26 DIAGNOSIS — Z7984 Long term (current) use of oral hypoglycemic drugs: Secondary | ICD-10-CM

## 2023-06-26 DIAGNOSIS — Z888 Allergy status to other drugs, medicaments and biological substances status: Secondary | ICD-10-CM

## 2023-06-26 DIAGNOSIS — E872 Acidosis, unspecified: Secondary | ICD-10-CM | POA: Diagnosis present

## 2023-06-26 DIAGNOSIS — I2510 Atherosclerotic heart disease of native coronary artery without angina pectoris: Secondary | ICD-10-CM | POA: Diagnosis present

## 2023-06-26 DIAGNOSIS — Z833 Family history of diabetes mellitus: Secondary | ICD-10-CM

## 2023-06-26 LAB — COMPREHENSIVE METABOLIC PANEL WITH GFR
ALT: 58 U/L — ABNORMAL HIGH (ref 0–44)
AST: 34 U/L (ref 15–41)
Albumin: 5.6 g/dL — ABNORMAL HIGH (ref 3.5–5.0)
Alkaline Phosphatase: 51 U/L (ref 38–126)
Anion gap: 15 (ref 5–15)
BUN: 17 mg/dL (ref 6–20)
CO2: 18 mmol/L — ABNORMAL LOW (ref 22–32)
Calcium: 10.7 mg/dL — ABNORMAL HIGH (ref 8.9–10.3)
Chloride: 104 mmol/L (ref 98–111)
Creatinine, Ser: 0.84 mg/dL (ref 0.61–1.24)
GFR, Estimated: >60 mL/min (ref >60)
Glucose, Bld: 166 mg/dL — ABNORMAL HIGH (ref 70–99)
Potassium: 4.1 mmol/L (ref 3.5–5.1)
Sodium: 137 mmol/L (ref 135–145)
Total Bilirubin: 1.3 mg/dL — ABNORMAL HIGH (ref 0.0–1.2)
Total Protein: 9.3 g/dL — ABNORMAL HIGH (ref 6.5–8.1)

## 2023-06-26 LAB — GLUCOSE, CAPILLARY: Glucose-Capillary: 115 mg/dL — ABNORMAL HIGH (ref 70–99)

## 2023-06-26 LAB — URINALYSIS, ROUTINE W REFLEX MICROSCOPIC
Bacteria, UA: NONE SEEN
Bilirubin Urine: NEGATIVE
Glucose, UA: >=500 mg/dL — AB
Hgb urine dipstick: NEGATIVE
Ketones, ur: 5 mg/dL — AB
Leukocytes,Ua: NEGATIVE
Nitrite: NEGATIVE
Protein, ur: NEGATIVE mg/dL
Specific Gravity, Urine: >1.046 — ABNORMAL HIGH (ref 1.005–1.030)
Squamous Epithelial / HPF: 0 /HPF (ref 0–5)
pH: 5 (ref 5.0–8.0)

## 2023-06-26 LAB — CBC
HCT: 53.4 % — ABNORMAL HIGH (ref 39.0–52.0)
Hemoglobin: 19 g/dL — ABNORMAL HIGH (ref 13.0–17.0)
MCH: 30.6 pg (ref 26.0–34.0)
MCHC: 35.6 g/dL (ref 30.0–36.0)
MCV: 86.1 fL (ref 80.0–100.0)
Platelets: 225 10*3/uL (ref 150–400)
RBC: 6.2 MIL/uL — ABNORMAL HIGH (ref 4.22–5.81)
RDW: 12.8 % (ref 11.5–15.5)
WBC: 11.4 10*3/uL — ABNORMAL HIGH (ref 4.0–10.5)
nRBC: 0 % (ref 0.0–0.2)

## 2023-06-26 LAB — LIPASE, BLOOD: Lipase: 38 U/L (ref 11–51)

## 2023-06-26 MED ORDER — INSULIN ASPART 100 UNIT/ML IJ SOLN: 0.0000 [IU] | Freq: Every day | INTRAMUSCULAR | Status: DC

## 2023-06-26 MED ORDER — ONDANSETRON HCL 4 MG PO TABS: 4.0000 mg | ORAL_TABLET | Freq: Four times a day (QID) | ORAL | Status: DC | PRN

## 2023-06-26 MED ORDER — KETOROLAC TROMETHAMINE 15 MG/ML IJ SOLN
15.0000 mg | Freq: Once | INTRAMUSCULAR | Status: AC
  Administered 2023-06-26: 15 mg via INTRAVENOUS
  Filled 2023-06-26: qty 1

## 2023-06-26 MED ORDER — SODIUM CHLORIDE 0.9 % IV BOLUS
1000.0000 mL | Freq: Once | INTRAVENOUS | Status: AC
  Administered 2023-06-26: 1000 mL via INTRAVENOUS

## 2023-06-26 MED ORDER — ONDANSETRON HCL 4 MG/2ML IJ SOLN
4.0000 mg | Freq: Once | INTRAMUSCULAR | Status: AC
  Administered 2023-06-26: 4 mg via INTRAVENOUS

## 2023-06-26 MED ORDER — ACETAMINOPHEN 650 MG RE SUPP: 650.0000 mg | Freq: Four times a day (QID) | RECTAL | Status: DC | PRN

## 2023-06-26 MED ORDER — FENTANYL CITRATE PF 50 MCG/ML IJ SOSY: 25.0000 ug | PREFILLED_SYRINGE | INTRAMUSCULAR | Status: DC | PRN

## 2023-06-26 MED ORDER — IOHEXOL 300 MG/ML  SOLN
100.0000 mL | Freq: Once | INTRAMUSCULAR | Status: AC | PRN
  Administered 2023-06-26: 100 mL via INTRAVENOUS

## 2023-06-26 MED ORDER — HEPARIN SODIUM (PORCINE) 5000 UNIT/ML IJ SOLN
5000.0000 [IU] | Freq: Three times a day (TID) | INTRAMUSCULAR | Status: DC
  Administered 2023-06-26 – 2023-06-28 (×5): 5000 [IU] via SUBCUTANEOUS
  Filled 2023-06-26 (×4): qty 1

## 2023-06-26 MED ORDER — HYDRALAZINE HCL 20 MG/ML IJ SOLN: 5.0000 mg | Freq: Four times a day (QID) | INTRAMUSCULAR | Status: DC | PRN

## 2023-06-26 MED ORDER — MORPHINE SULFATE (PF) 2 MG/ML IV SOLN
2.0000 mg | Freq: Once | INTRAVENOUS | Status: AC
  Administered 2023-06-26: 2 mg via INTRAVENOUS
  Filled 2023-06-26: qty 1

## 2023-06-26 MED ORDER — DIATRIZOATE MEGLUMINE & SODIUM 66-10 % PO SOLN
90.0000 mL | Freq: Once | ORAL | Status: AC
  Administered 2023-06-26: 90 mL via NASOGASTRIC

## 2023-06-26 MED ORDER — MORPHINE SULFATE (PF) 4 MG/ML IV SOLN
4.0000 mg | Freq: Once | INTRAVENOUS | Status: AC
  Administered 2023-06-26: 4 mg via INTRAVENOUS
  Filled 2023-06-26: qty 1

## 2023-06-26 MED ORDER — SODIUM CHLORIDE 0.9 % IV SOLN: 12.5000 mg | Freq: Four times a day (QID) | INTRAVENOUS | Status: AC | PRN

## 2023-06-26 MED ORDER — SODIUM CHLORIDE 0.9 % IV SOLN: INTRAVENOUS | Status: AC

## 2023-06-26 MED ORDER — ONDANSETRON HCL 4 MG/2ML IJ SOLN: 4.0000 mg | Freq: Four times a day (QID) | INTRAMUSCULAR | Status: DC | PRN

## 2023-06-26 MED ORDER — INSULIN ASPART 100 UNIT/ML IJ SOLN
0.0000 [IU] | Freq: Three times a day (TID) | INTRAMUSCULAR | Status: DC
  Administered 2023-06-27 (×2): 2 [IU] via SUBCUTANEOUS
  Administered 2023-06-28: 1 [IU] via SUBCUTANEOUS
  Filled 2023-06-26 (×2): qty 1

## 2023-06-26 MED ORDER — MORPHINE SULFATE (PF) 4 MG/ML IV SOLN: 4.0000 mg | INTRAVENOUS | Status: DC | PRN

## 2023-06-26 MED ORDER — ACETAMINOPHEN 325 MG PO TABS
650.0000 mg | ORAL_TABLET | Freq: Four times a day (QID) | ORAL | Status: DC | PRN
Start: 2023-06-26 — End: 2023-06-28

## 2023-06-26 NOTE — Assessment & Plan Note (Signed)
 Suspect secondary to peritoneal adhesion per CT read in setting of prior history of sigmoidectomy Symptomatic support Continue with sodium chloride  infusion at 150 mL/h, 1 day ordered General Surgery has been consulted by EDP who recommends NG tube placement Strict I's and O's

## 2023-06-26 NOTE — Assessment & Plan Note (Signed)
 Mild suspect secondary to vomiting Continue IV fluid medication and symptomatic support per above Recheck BMP in a.m.

## 2023-06-26 NOTE — H&P (Signed)
 History and Physical   Francisco Porter:[id number] DOB: 10/25/1965 DOA: 06/26/2023  PCP: Francisco San, MD  Patient coming from: Home via POV  I have personally briefly reviewed patient's old medical records in [facility] EMR.  Chief Concern: Abdominal pain  HPI: Mr. Francisco Porter is a 58 year old male with history of diverticulosis status post sigmoidectomy, hypertension, cad, non-insulin -dependent diabetes mellitus, hyperlipidemia, who presents emergency department for chief concerns of abdominal pain.  Vitals in the ED showed temperature of 9010.9, respiration 20, heart rate 90, blood pressure 131/101, SpO2 97% on room air.  Serum sodium is 137, potassium 4.1, chloride 104, bicarb 18, BUN of 17, serum creatinine 0.84, EGFR greater than 60, nonfasting blood glucose 166, WBC 11.4, hemoglobin 19, platelets of 225.  ED treatment: Toradol  15 mg IV one-time dose, morphine  4 mg IV one-time dose, morphine  2 mg IV one-time dose, sodium chloride  1 L bolus, ondansetron  4 mg IV one-time dose. ------------------------------------------- At bedside, patient able to tell me his name, age, current location, current calendar year.   He reports he started hurtign 3:30a this morning. He last had a normal bowel movement yesterday and denies bleeding. Vomiting started 1:30p today. He vomitted old food from yestserday and bile that is bitter. He deneis chest pain, shortness of breath, dysuria, hematuria.   He was crawling on the carpeted floor needing to vomiting and hurting when he thought he may have passed out from the pain. He woke up. He deneis head truama.   Social history: He lives with his daughter at home. He denies tobacco, etoh, recreational drug use. He works for a [employer]. His last beer was Friday, 1-2 beers, 1-2 days per week. Max is 4 beers per week.   ROS: Constitutional: no weight change, no fever ENT/Mouth: no sore throat, no rhinorrhea Eyes: no eye pain, no vision  changes Cardiovascular: no chest pain, no dyspnea,  no edema, no palpitations Respiratory: no cough, no sputum, no wheezing Gastrointestinal: + nausea, + vomiting, no diarrhea, no constipation Genitourinary: no urinary incontinence, no dysuria, no hematuria Musculoskeletal: no arthralgias, no myalgias Skin: no skin lesions, no pruritus, Neuro: + weakness, no loss of consciousness, no syncope Psych: no anxiety, no depression, + decrease appetite Heme/Lymph: no bruising, no bleeding  ED Course: Discussed with EDP, patient requiring hospitalization for chief concerns of small bowel obstruction.  Assessment/Plan  Principal Problem:   Small bowel obstruction (HCC) Active Problems:   Essential hypertension   CAD (coronary artery disease)   Metabolic acidosis   Diabetes mellitus without complication (HCC)   Hyperlipidemia associated with type 2 diabetes mellitus (HCC)   Assessment and Plan:  * Small bowel obstruction (HCC) Suspect secondary to peritoneal adhesion per CT read in setting of prior history of sigmoidectomy Symptomatic support Continue with sodium chloride  infusion at 150 mL/h, 1 day ordered General Surgery has been consulted by EDP who recommends NG tube placement Strict I's and O's  Diabetes mellitus without complication (HCC) Insulin  SSI with at bedtime coverage ordered  Metabolic acidosis Mild suspect secondary to vomiting Continue IV fluid medication and symptomatic support per above Recheck BMP in a.m.  CAD (coronary artery disease) Home aspirin and rosuvastatin  are not resumed on admission  Chart reviewed.   AM team to resume home PO medications when appropriate.  DVT prophylaxis: Heparin  5000 units subcutaneous every 8 hours Code Status: Full code Diet: N.p.o. pending general surgery clearance and hospital course Family Communication: A phone call was offered, patient declined stating that his daughter  Francisco Porter was being admitted to the  hospital Disposition Plan: Pending clinical course Consults called: General Surgery via EDP Admission status: Telemetry surgical, observation  Past Medical History:  Diagnosis Date   Diabetes mellitus without complication (HCC)    2002   Diverticulitis    Family history of anesthesia complication    his mother has heart mur mur   Hyperlipidemia    Hypertension    Past Surgical History:  Procedure Laterality Date   COLON SURGERY  11/2013   PARTIAL COLECTOMY FOR DIVERTICULITIS   COLONOSCOPY  2014   COLONOSCOPY WITH PROPOFOL  N/A 11/29/2016   Procedure: COLONOSCOPY WITH PROPOFOL ;  Surgeon: Deveron Fly, MD;  Location: [facility] ENDOSCOPY;  Service: Endoscopy;  Laterality: N/A;   HERNIA REPAIR  04/07/2015   Laparoscopic placement of a 10 x 15 ventral light mesh   INSERTION OF MESH N/A 04/07/2015   Procedure: INSERTION OF MESH;  Surgeon: Marshall Skeeter, MD;  Location: [facility] ORS;  Service: General;  Laterality: N/A;   JOINT REPLACEMENT     LAPAROSCOPIC SIGMOID COLECTOMY N/A 11/29/2013   Dr Thompson/Procedure: LAPAROSCOPIC ASSISTED SIGMOID COLECTOMY;  Surgeon: Dorena Gander, MD;  Location: [facility] OR;  Service: General;  Laterality: N/A;   LAPAROSCOPY N/A 04/07/2015   Procedure: LAPAROSCOPY DIAGNOSTIC;  Surgeon: Marshall Skeeter, MD;  Location: [facility] ORS;  Service: General;  Laterality: N/A;   R shoulder surgery     VENTRAL HERNIA REPAIR N/A 04/07/2015   Procedure: HERNIA REPAIR VENTRAL ADULT;  Surgeon: Marshall Skeeter, MD;  Location: [facility] ORS;  Service: General;  Laterality: N/A;   Social History:  reports that he has never smoked. His smokeless tobacco use includes chew. He reports current alcohol use of about 4.0 standard drinks of alcohol per week. He reports that he does not use drugs.  Allergies  Allergen Reactions   Atorvastatin Other (See Comments)    Myalgia   Codeine Hives and Rash   Family History  Problem Relation Age of Onset   Heart disease Mother    Diabetes Mother     Heart Problems Mother    Coronary artery disease Mother        CABG   Heart attack Father 71   Heart Problems Father    Heart failure Father    Bowel Disease Father    Diabetes Father    Fainting Maternal Grandfather    Fainting Paternal Grandmother    Fainting Paternal Grandfather    Family history: Family history reviewed and not pertinent  Prior to Admission medications   Medication Sig Start Date End Date Taking? Authorizing Provider  aspirin EC 81 MG tablet Take 81 mg by mouth daily. Swallow whole.    [provider]  glipiZIDE (GLUCOTROL XL) 5 MG 24 hr tablet Take 5 mg by mouth daily. Patient not taking: Reported on 03/07/2022 09/23/20   [provider]  HYDROcodone -acetaminophen  (NORCO/VICODIN) 5-325 MG tablet Take 1 tablet by mouth every 6 (six) hours as needed for moderate pain. Patient not taking: Reported on 08/07/2022 03/08/22   Flynn Hylan, MD  losartan  (COZAAR ) 25 MG tablet Take 25 mg by mouth daily.    [provider]  montelukast  (SINGULAIR ) 10 MG tablet Take 10 mg by mouth daily. 09/23/20   [provider]  Omega-3 Fatty Acids (FISH OIL) 1000 MG CAPS Take 2 capsules by mouth daily at 8 pm.    [provider]  omeprazole (PRILOSEC) 20 MG capsule Take 20 mg by mouth daily. 09/25/20  [provider]  psyllium (METAMUCIL) 58.6 % packet Take 1 packet by mouth daily. Patient not taking: Reported on 08/07/2022    [provider]  rosuvastatin  (CRESTOR ) 40 MG tablet TAKE 1 TABLET BY MOUTH EVERY DAY 04/14/23   End, Veryl Gottron, MD  Semaglutide,0.25 or 0.5MG /DOS, 2 MG/3ML SOPN Inject 0.25 mg into the skin once a week. Monday 02/21/22   [provider]  SYNJARDY XR 12.05-998 MG TB24 Take 2 tablets by mouth daily. 09/25/20   [provider]  tadalafil (CIALIS) 20 MG tablet Take 20 mg by mouth daily as needed for erectile dysfunction. 01/02/22 08/07/22  [provider]   Physical Exam: Vitals:    06/26/23 1408 06/26/23 1521 06/26/23 1521 06/26/23 1730  BP: (!) 131/101 118/86  126/73  Pulse: 90 85  91  Resp: 20 16  14   Temp: 97.9 F (36.6 C)   98.3 F (36.8 C)  TempSrc: Oral   Oral  SpO2: 97% 97%  93%  Weight:   86.2 kg   Height:   5\' 7"  (1.702 m)    Constitutional: appears age-appropriate, NAD, calm Eyes: PERRL, lids and conjunctivae normal ENMT: Mucous membranes are moist. Posterior pharynx clear of any exudate or lesions. Age-appropriate dentition. Hearing appropriate Neck: normal, supple, no masses, no thyromegaly Respiratory: clear to auscultation bilaterally, no wheezing, no crackles. Normal respiratory effort. No accessory muscle use.  Cardiovascular: Regular rate and rhythm, no murmurs / rubs / gallops. No extremity edema. 2+ pedal pulses. No carotid bruits.  Abdomen: + tenderness, no masses palpated, no hepatosplenomegaly.  Decreased bowel sounds.  Musculoskeletal: no clubbing / cyanosis. No joint deformity upper and lower extremities. Good ROM, no contractures, no atrophy. Normal muscle tone.  Skin: no rashes, lesions, ulcers. No induration Neurologic: Sensation intact. Strength 5/5 in all 4.  Psychiatric: Normal judgment and insight. Alert and oriented x 3. Normal mood.   EKG: independently reviewed, showing sinus rhythm with rate 95,right bundle branch block, QTc 432  Chest x-ray on Admission: I personally reviewed and I agree with radiologist reading as below.  CT ABDOMEN PELVIS W CONTRAST Result Date: 06/26/2023 CLINICAL DATA:  Abdominal pain. History of recurrent small bowel obstruction. EXAM: CT ABDOMEN AND PELVIS WITH CONTRAST TECHNIQUE: Multidetector CT imaging of the abdomen and pelvis was performed using the standard protocol following bolus administration of intravenous contrast. RADIATION DOSE REDUCTION: This exam was performed according to the departmental dose-optimization program which includes automated exposure control, adjustment of the mA and/or kV  according to patient size and/or use of iterative reconstruction technique. CONTRAST:  OMNIPAQUE  IOHEXOL  300 MG/ML  SOLN COMPARISON:  CT abdomen pelvis dated 03/06/2022. FINDINGS: Lower chest: The visualized lung bases are clear. No intra-abdominal free air or free fluid. Hepatobiliary: The liver is unremarkable. No biliary dilatation. Small gallstones. No pericholecystic fluid or evidence of acute cholecystitis by CT. Pancreas: Unremarkable. No pancreatic ductal dilatation or surrounding inflammatory changes. Spleen: Normal in size without focal abnormality. Adrenals/Urinary Tract: The adrenal glands unremarkable. The kidneys, visualized ureters, and urinary bladder appear unremarkable. Stomach/Bowel: Multiple dilated loops of small bowel measure up to 4 cm in caliber. A transition is noted in the anterior right lower abdomen (62/2 and coronal 25/5) likely related to peritoneal adhesions. There is diffuse colonic diverticulosis. Postsurgical changes of the sigmoid colon with anastomotic staple line. The appendix is normal. Vascular/Lymphatic: Mild aortoiliac atherosclerotic disease. The IVC is unremarkable. No portal venous gas. There is no adenopathy. Reproductive: The prostate and seminal vesicles are grossly remarkable.  No pelvic mass. Other: Ventral hernia repair mesh.  No recurrent hernia. Musculoskeletal: No acute or significant osseous findings. IMPRESSION: 1. Small bowel obstruction with a transition in the anterior right lower abdomen likely related to peritoneal adhesions. 2. Colonic diverticulosis. Normal appendix. 3. Cholelithiasis. 4.  Aortic Atherosclerosis (ICD10-I70.0). Electronically Signed   By: Angus Bark M.D.   On: 06/26/2023 17:38   Labs on Admission: I have personally reviewed following labs  CBC: Recent Labs  Lab 06/26/23 1409  WBC 11.4*  HGB 19.0*  HCT 53.4*  MCV 86.1  PLT 225   Basic Metabolic Panel: Recent Labs  Lab 06/26/23 1409  NA 137  K 4.1  CL 104  CO2  18*  GLUCOSE 166*  BUN 17  CREATININE 0.84  CALCIUM  10.7*   GFR: Estimated Creatinine Clearance: 101.7 mL/min (by C-G formula based on SCr of 0.84 mg/dL).  Liver Function Tests: Recent Labs  Lab 06/26/23 1409  AST 34  ALT 58*  ALKPHOS 51  BILITOT 1.3*  PROT 9.3*  ALBUMIN 5.6*   Recent Labs  Lab 06/26/23 1409  LIPASE 38   Urine analysis:    Component Value Date/Time   COLORURINE STRAW (A) 06/26/2023 1741   APPEARANCEUR CLEAR (A) 06/26/2023 1741   APPEARANCEUR Clear 05/02/2013 1607   LABSPEC >1.046 (H) 06/26/2023 1741   LABSPEC 1.004 05/02/2013 1607   PHURINE 5.0 06/26/2023 1741   GLUCOSEU >=500 (A) 06/26/2023 1741   GLUCOSEU Negative 05/02/2013 1607   HGBUR NEGATIVE 06/26/2023 1741   BILIRUBINUR NEGATIVE 06/26/2023 1741   BILIRUBINUR Negative 05/02/2013 1607   KETONESUR 5 (A) 06/26/2023 1741   PROTEINUR NEGATIVE 06/26/2023 1741   NITRITE NEGATIVE 06/26/2023 1741   LEUKOCYTESUR NEGATIVE 06/26/2023 1741   LEUKOCYTESUR Negative 05/02/2013 1607   This document was prepared using Dragon Voice Recognition software and may include unintentional dictation errors.  Dr. Reinhold Carbine [facility]  If 7PM-7AM, please contact overnight-coverage provider If 7AM-7PM, please contact day attending provider [email or web address]  06/26/2023, 6:38 PM

## 2023-06-26 NOTE — Assessment & Plan Note (Signed)
 Home aspirin and rosuvastatin  are not resumed on admission

## 2023-06-26 NOTE — Consult Note (Signed)
 Subjective:   CC: Small bowel obstruction  HPI:  Francisco Porter is a 58 y.o. male who is consulted by [facility] for evaluation of above cc.  Symptoms were first noted 1 day ago. Pain is sharp, lower quadrant  Associated with nausea vomiting, exacerbated by nothing.  History of SBO's in the past resolved with conservative management.     Past Medical History:  has a past medical history of Diabetes mellitus without complication (HCC), Diverticulitis, Family history of anesthesia complication, Hyperlipidemia, and Hypertension.  Past Surgical History:  has a past surgical history that includes R shoulder surgery; Laparoscopic sigmoid colectomy (N/A, 11/29/2013); Colonoscopy (2014); Ventral hernia repair (N/A, 04/07/2015); Insertion of mesh (N/A, 04/07/2015); laparoscopy (N/A, 04/07/2015); Hernia repair (04/07/2015); Joint replacement; Colon surgery (11/2013); and Colonoscopy with propofol  (N/A, 11/29/2016).  Family History: family history includes Bowel Disease in his father; Coronary artery disease in his mother; Diabetes in his father and mother; Fainting in his maternal grandfather, paternal grandfather, and paternal grandmother; Heart Problems in his father and mother; Heart attack (age of onset: 51) in his father; Heart disease in his mother; Heart failure in his father.  Social History:  reports that he has never smoked. His smokeless tobacco use includes chew. He reports current alcohol use of about 4.0 standard drinks of alcohol per week. He reports that he does not use drugs.  Current Medications:  Prior to Admission medications   Medication Sig Start Date End Date Taking? Authorizing Provider  aspirin EC 81 MG tablet Take 81 mg by mouth daily. Swallow whole.    [provider]  glipiZIDE (GLUCOTROL XL) 5 MG 24 hr tablet Take 5 mg by mouth daily. Patient not taking: Reported on 03/07/2022 09/23/20   [provider]  HYDROcodone -acetaminophen  (NORCO/VICODIN) 5-325 MG tablet Take 1  tablet by mouth every 6 (six) hours as needed for moderate pain. Patient not taking: Reported on 08/07/2022 03/08/22   Flynn Hylan, MD  losartan  (COZAAR ) 25 MG tablet Take 25 mg by mouth daily.    [provider]  montelukast  (SINGULAIR ) 10 MG tablet Take 10 mg by mouth daily. 09/23/20   [provider]  Omega-3 Fatty Acids (FISH OIL) 1000 MG CAPS Take 2 capsules by mouth daily at 8 pm.    [provider]  omeprazole (PRILOSEC) 20 MG capsule Take 20 mg by mouth daily. 09/25/20   [provider]  psyllium (METAMUCIL) 58.6 % packet Take 1 packet by mouth daily. Patient not taking: Reported on 08/07/2022    [provider]  rosuvastatin  (CRESTOR ) 40 MG tablet TAKE 1 TABLET BY MOUTH EVERY DAY 04/14/23   End, Veryl Gottron, MD  Semaglutide,0.25 or 0.5MG /DOS, 2 MG/3ML SOPN Inject 0.25 mg into the skin once a week. Monday 02/21/22   [provider]  SYNJARDY XR 12.05-998 MG TB24 Take 2 tablets by mouth daily. 09/25/20   [provider]  tadalafil (CIALIS) 20 MG tablet Take 20 mg by mouth daily as needed for erectile dysfunction. 01/02/22 08/07/22  [provider]    Allergies:  Allergies as of 06/26/2023 - Review Complete 06/26/2023  Allergen Reaction Noted   Atorvastatin Other (See Comments) 09/27/2020   Codeine Hives and Rash 05/03/2013    ROS:  Pertinent positives and negatives noted in HPI   Objective:     BP 126/73 (BP Location: Left Arm)   Pulse 91   Temp 98.3 F (36.8 C) (Oral)   Resp 14   Ht 5\' 7"  (1.702 m)   Wt  86.2 kg   SpO2 93%   BMI 29.76 kg/m    Constitutional :  alert, cooperative, appears stated age, and no distress  Respiratory:  Clear to auscultation bilaterally  Cardiovascular:  Regular rate and rhythm  Gastrointestinal: soft, non-tender; bowel sounds normal; no masses,  no organomegaly.   Skin: Cool and moist, surgical scars  Psychiatric: Normal affect, non-agitated, not confused       LABS:      Latest Ref Rng & Units 06/26/2023    2:09 PM 03/08/2022    2:35 AM 03/07/2022    3:43 PM  CMP  Glucose 70 - 99 mg/dL 295  98    BUN 6 - 20 mg/dL 17  13    Creatinine 6.21 - 1.24 mg/dL 3.08  6.57    Sodium 846 - 145 mmol/L 137  136    Potassium 3.5 - 5.1 mmol/L 4.1  3.5  4.0   Chloride 98 - 111 mmol/L 104  108    CO2 22 - 32 mmol/L 18  20    Calcium  8.9 - 10.3 mg/dL 96.2  8.0    Total Protein 6.5 - 8.1 g/dL 9.3     Total Bilirubin 0.0 - 1.2 mg/dL 1.3     Alkaline Phos 38 - 126 U/L 51     AST 15 - 41 U/L 34     ALT 0 - 44 U/L 58         Latest Ref Rng & Units 06/26/2023    2:09 PM 03/07/2022    5:31 AM 03/06/2022   11:31 AM  CBC  WBC 4.0 - 10.5 K/uL 11.4  8.1  12.7   Hemoglobin 13.0 - 17.0 g/dL 95.2  84.1  32.4   Hematocrit 39.0 - 52.0 % 53.4  47.2  50.9   Platelets 150 - 400 K/uL 225  227  224      RADS: CLINICAL DATA:  Abdominal pain. History of recurrent small bowel obstruction.   EXAM: CT ABDOMEN AND PELVIS WITH CONTRAST   TECHNIQUE: Multidetector CT imaging of the abdomen and pelvis was performed using the standard protocol following bolus administration of intravenous contrast.   RADIATION DOSE REDUCTION: This exam was performed according to the departmental dose-optimization program which includes automated exposure control, adjustment of the mA and/or kV according to patient size and/or use of iterative reconstruction technique.   CONTRAST:  OMNIPAQUE  IOHEXOL  300 MG/ML  SOLN   COMPARISON:  CT abdomen pelvis dated 03/06/2022.   FINDINGS: Lower chest: The visualized lung bases are clear.   No intra-abdominal free air or free fluid.   Hepatobiliary: The liver is unremarkable. No biliary dilatation. Small gallstones. No pericholecystic fluid or evidence of acute cholecystitis by CT.   Pancreas: Unremarkable. No pancreatic ductal dilatation or surrounding inflammatory changes.   Spleen: Normal in size without focal abnormality.   Adrenals/Urinary  Tract: The adrenal glands unremarkable. The kidneys, visualized ureters, and urinary bladder appear unremarkable.   Stomach/Bowel: Multiple dilated loops of small bowel measure up to 4 cm in caliber. A transition is noted in the anterior right lower abdomen (62/2 and coronal 25/5) likely related to peritoneal adhesions. There is diffuse colonic diverticulosis. Postsurgical changes of the sigmoid colon with anastomotic staple line. The appendix is normal.   Vascular/Lymphatic: Mild aortoiliac atherosclerotic disease. The IVC is unremarkable. No portal venous gas. There is no adenopathy.   Reproductive: The prostate and seminal vesicles are grossly remarkable. No pelvic mass.   Other: Ventral hernia repair  mesh.  No recurrent hernia.   Musculoskeletal: No acute or significant osseous findings.   IMPRESSION: 1. Small bowel obstruction with a transition in the anterior right lower abdomen likely related to peritoneal adhesions. 2. Colonic diverticulosis. Normal appendix. 3. Cholelithiasis. 4.  Aortic Atherosclerosis (ICD10-I70.0).     Electronically Signed   By: Angus Bark M.D.   On: 06/26/2023 17:38  Assessment:   SBO  Plan:   Will proceed with small bowel follow-through protocol.  N.p.o., sips with meds okay   the patient verbalized understanding and all questions were answered to the patient's satisfaction.  labs/images/medications/previous chart entries reviewed personally and relevant changes/updates noted above.

## 2023-06-26 NOTE — ED Triage Notes (Signed)
 Pt to ED via POV from home. Pt reports upper abd pain that awoke him from sleep at 3am. Pt reports pain was intermittent but is now constant. Pt reports hx of 2 SBO in the past and feels the same. Pt reports N/V. No fevers.

## 2023-06-26 NOTE — ED Provider Notes (Signed)
 [facility] Provider Note    Event Date/Time   First MD Initiated Contact with Patient 06/26/23 1456     (approximate)   History   Abdominal Pain  Pt to ED via POV from home. Pt reports upper abd pain that awoke him from sleep at 3am. Pt reports pain was intermittent but is now constant. Pt reports hx of 2 SBO in the past and feels the same. Pt reports N/V. No fevers.    HPI Francisco Porter is a 58 y.o. male PMH recurrent diverticulitis, recurrent SBOs, status post sigmoidectomy, hypertension, hyperlipidemia, DM2, CAD, DM2 presents for evaluation of abdominal pain - Woke patient from sleep around 3:30 AM.  Started in epigastrium, has now spread to right upper and right lower quadrants.  1 episode of vomiting, nonbloody/nonbilious.  Bowel movement yesterday, not black/nonbloody. - Abdominal surgical history of sigmoidectomy in 2015 as well as subsequent ventral hernia repair - No fever - Patient states he was crawling on the ground earlier today feeling nauseous and had a brief loss of consciousness episode, no significant head strike, believes he was only unconscious for a few seconds.  No chest pain or shortness of breath. - Pain currently 7/10.  Last p.o. intake greater than 8 hours.  Per chart review, patient was last admitted to outside hospital in July 2024.  Managed nonoperatively.Aaron Aas     Physical Exam   Triage Vital Signs: ED Triage Vitals [06/26/23 1408]  Encounter Vitals Group     BP (!) 131/101     Systolic BP Percentile      Diastolic BP Percentile      Pulse Rate 90     Resp 20     Temp 97.9 F (36.6 C)     Temp Source Oral     SpO2 97 %     Weight      Height      Head Circumference      Peak Flow      Pain Score 10     Pain Loc      Pain Education      Exclude from Growth Chart     Most recent vital signs: Vitals:   06/26/23 1521 06/26/23 1730  BP: 118/86 126/73  Pulse: 85 91  Resp: 16 14  Temp:  98.3 F (36.8 C)  SpO2: 97%  93%   General: Awake, appears uncomfortable though nontoxic CV:  Good peripheral perfusion. RRR, RP 2+ Resp:  Normal effort. CTAB Abd:  Mildly distended.  Significant tenderness to palpation in epigastrium and right lower quadrant, moderate tenderness in right upper quadrant.  No left-sided tenderness.  No CVA tenderness bilaterally.     ED Results / Procedures / Treatments   Labs (all labs ordered are listed, but only abnormal results are displayed) Labs Reviewed  COMPREHENSIVE METABOLIC PANEL WITH GFR - Abnormal; Notable for the following components:      Result Value   CO2 18 (*)    Glucose, Bld 166 (*)    Calcium  10.7 (*)    Total Protein 9.3 (*)    Albumin 5.6 (*)    ALT 58 (*)    Total Bilirubin 1.3 (*)    All other components within normal limits  CBC - Abnormal; Notable for the following components:   WBC 11.4 (*)    RBC 6.20 (*)    Hemoglobin 19.0 (*)    HCT 53.4 (*)    All other components within normal limits  URINALYSIS, ROUTINE W REFLEX MICROSCOPIC - Abnormal; Notable for the following components:   Color, Urine STRAW (*)    APPearance CLEAR (*)    Specific Gravity, Urine >1.046 (*)    Glucose, UA >=500 (*)    Ketones, ur 5 (*)    All other components within normal limits  LIPASE, BLOOD     EKG See ED course below.    RADIOLOGY Radiology interpreted by myself and radiology report reviewed.  Concerning for small bowel obstruction.    PROCEDURES:  Critical Care performed: No  Procedures   MEDICATIONS ORDERED IN ED: Medications  ondansetron  (ZOFRAN ) injection 4 mg (4 mg Intravenous Given 06/26/23 1420)  morphine  (PF) 4 MG/ML injection 4 mg (4 mg Intravenous Given 06/26/23 1510)  ketorolac  (TORADOL ) 15 MG/ML injection 15 mg (15 mg Intravenous Given 06/26/23 1510)  sodium chloride  0.9 % bolus 1,000 mL (0 mLs Intravenous Stopped 06/26/23 1733)  iohexol  (OMNIPAQUE ) 300 MG/ML solution 100 mL (100 mLs Intravenous Contrast Given 06/26/23 1539)  morphine   (PF) 2 MG/ML injection 2 mg (2 mg Intravenous Given 06/26/23 1748)     IMPRESSION / MDM / ASSESSMENT AND PLAN / ED COURSE  I reviewed the triage vital signs and the nursing notes.                              DDX/MDM/AP: Differential diagnosis includes, but is not limited to, recurrent SBO, appendicitis, cholecystitis, pancreatitis, atypical diverticulitis.  Consider early gastroenteritis, perforated viscus.  Doubt cardiac etiology based on history and exam, will screen with EKG.  Plan: - N.p.o. - Labs - Pain control, antiemetics, IV fluid - CT abdomen pelvis  Patient's presentation is most consistent with acute presentation with potential threat to life or bodily function.  The patient is on the cardiac monitor to evaluate for evidence of arrhythmia and/or significant heart rate changes.  ED course below.  Admitted to hospitalist for nonsurgical treatment of small bowel obstruction after discussion with general surgery.  Clinical Course as of 06/26/23 1800  Thu Jun 26, 2023  1501 CMP reviewed, somewhat low bicarb, very mild bump in bili and ALT.  Overall unremarkable.   [MM]  1501 CBC with mild leukocytosis, elevated hemoglobin likely secondary to hemoconcentration.  Lipase normal [MM]  1512 ecg = sinus rhythm, rate 95, no gross ST elevation or depression, isolated T wave inversion in lead III is nonspecific, left axis deviation, normal intervals.  No clear evidence of ischemia nor arrhythmia on my read. [MM]  1747 CTAP: IMPRESSION: 1. Small bowel obstruction with a transition in the anterior right lower abdomen likely related to peritoneal adhesions. 2. Colonic diverticulosis. Normal appendix. 3. Cholelithiasis. 4.  Aortic Atherosclerosis (ICD10-I70.0).   [MM]  1749 D/w Dr. Rosea Conch of general surgery - NGT - admit - no other recs at this time [MM]  1750 Hospitalist consult ordered placed [MM]    Clinical Course User Index [MM] Collis Deaner, MD     FINAL CLINICAL  IMPRESSION(S) / ED DIAGNOSES   Final diagnoses:  Small bowel obstruction (HCC)     Rx / DC Orders   ED Discharge Orders     None        Note:  This document was prepared using Dragon voice recognition software and may include unintentional dictation errors.   Collis Deaner, MD 06/26/23 [phone]

## 2023-06-26 NOTE — Hospital Course (Addendum)
 Mr. Francisco Porter is a 58 year old male with history of diverticulosis status post sigmoidectomy, hypertension, cad, non-insulin -dependent diabetes mellitus, hyperlipidemia, who presents emergency department for chief concerns of abdominal pain.  Vitals in the ED showed T of 97.9, rr 20, hr 90, blood pressure 131/101, SpO2 97% on room air.  Serum sodium is 137, potassium 4.1, chloride 104, bicarb 18, BUN of 17, serum creatinine 0.84, EGFR greater than 60, nonfasting blood glucose 166, WBC 11.4, hemoglobin 19, platelets of 225.  ED treatment: Toradol  15 mg IV one-time dose, morphine  4 mg IV one-time dose, morphine  2 mg IV one-time dose, NS 1 L bolus, ondansetron  4 mg IV one-time dose.

## 2023-06-26 NOTE — Assessment & Plan Note (Signed)
 Insulin SSI with at bedtime coverage ordered

## 2023-06-27 ENCOUNTER — Observation Stay

## 2023-06-27 DIAGNOSIS — Z79899 Other long term (current) drug therapy: Secondary | ICD-10-CM | POA: Diagnosis not present

## 2023-06-27 DIAGNOSIS — Z9049 Acquired absence of other specified parts of digestive tract: Secondary | ICD-10-CM | POA: Diagnosis not present

## 2023-06-27 DIAGNOSIS — Z833 Family history of diabetes mellitus: Secondary | ICD-10-CM | POA: Diagnosis not present

## 2023-06-27 DIAGNOSIS — K565 Intestinal adhesions [bands], unspecified as to partial versus complete obstruction: Secondary | ICD-10-CM | POA: Diagnosis present

## 2023-06-27 DIAGNOSIS — Z888 Allergy status to other drugs, medicaments and biological substances status: Secondary | ICD-10-CM | POA: Diagnosis not present

## 2023-06-27 DIAGNOSIS — Z885 Allergy status to narcotic agent status: Secondary | ICD-10-CM | POA: Diagnosis not present

## 2023-06-27 DIAGNOSIS — E872 Acidosis, unspecified: Secondary | ICD-10-CM | POA: Diagnosis present

## 2023-06-27 DIAGNOSIS — I2510 Atherosclerotic heart disease of native coronary artery without angina pectoris: Secondary | ICD-10-CM | POA: Diagnosis present

## 2023-06-27 DIAGNOSIS — E1169 Type 2 diabetes mellitus with other specified complication: Secondary | ICD-10-CM | POA: Diagnosis present

## 2023-06-27 DIAGNOSIS — K56609 Unspecified intestinal obstruction, unspecified as to partial versus complete obstruction: Secondary | ICD-10-CM | POA: Diagnosis present

## 2023-06-27 DIAGNOSIS — Z7985 Long-term (current) use of injectable non-insulin antidiabetic drugs: Secondary | ICD-10-CM | POA: Diagnosis not present

## 2023-06-27 DIAGNOSIS — Z720 Tobacco use: Secondary | ICD-10-CM | POA: Diagnosis not present

## 2023-06-27 DIAGNOSIS — I10 Essential (primary) hypertension: Secondary | ICD-10-CM | POA: Diagnosis present

## 2023-06-27 DIAGNOSIS — Z7982 Long term (current) use of aspirin: Secondary | ICD-10-CM | POA: Diagnosis not present

## 2023-06-27 DIAGNOSIS — E785 Hyperlipidemia, unspecified: Secondary | ICD-10-CM | POA: Diagnosis present

## 2023-06-27 DIAGNOSIS — Z8249 Family history of ischemic heart disease and other diseases of the circulatory system: Secondary | ICD-10-CM | POA: Diagnosis not present

## 2023-06-27 DIAGNOSIS — Z7984 Long term (current) use of oral hypoglycemic drugs: Secondary | ICD-10-CM | POA: Diagnosis not present

## 2023-06-27 LAB — HEMOGLOBIN A1C
Hgb A1c MFr Bld: 6.7 % — ABNORMAL HIGH (ref 4.8–5.6)
Mean Plasma Glucose: 145.59 mg/dL

## 2023-06-27 LAB — BASIC METABOLIC PANEL WITH GFR
Anion gap: 10 (ref 5–15)
BUN: 19 mg/dL (ref 6–20)
CO2: 21 mmol/L — ABNORMAL LOW (ref 22–32)
Calcium: 8.5 mg/dL — ABNORMAL LOW (ref 8.9–10.3)
Chloride: 109 mmol/L (ref 98–111)
Creatinine, Ser: 0.76 mg/dL (ref 0.61–1.24)
GFR, Estimated: >60 mL/min (ref >60)
Glucose, Bld: 89 mg/dL (ref 70–99)
Potassium: 3.9 mmol/L (ref 3.5–5.1)
Sodium: 140 mmol/L (ref 135–145)

## 2023-06-27 LAB — CBC
HCT: 43.9 % (ref 39.0–52.0)
Hemoglobin: 15.7 g/dL (ref 13.0–17.0)
MCH: 31.3 pg (ref 26.0–34.0)
MCHC: 35.8 g/dL (ref 30.0–36.0)
MCV: 87.6 fL (ref 80.0–100.0)
Platelets: 147 10*3/uL — ABNORMAL LOW (ref 150–400)
RBC: 5.01 MIL/uL (ref 4.22–5.81)
RDW: 13 % (ref 11.5–15.5)
WBC: 6.4 10*3/uL (ref 4.0–10.5)
nRBC: 0 % (ref 0.0–0.2)

## 2023-06-27 LAB — GLUCOSE, CAPILLARY
Glucose-Capillary: 83 mg/dL (ref 70–99)
Glucose-Capillary: 181 mg/dL — ABNORMAL HIGH (ref 70–99)
Glucose-Capillary: 181 mg/dL — ABNORMAL HIGH (ref 70–99)
Glucose-Capillary: 144 mg/dL — ABNORMAL HIGH (ref 70–99)

## 2023-06-27 MED ORDER — MORPHINE SULFATE (PF) 4 MG/ML IV SOLN: 4.0000 mg | INTRAVENOUS | Status: AC | PRN

## 2023-06-27 NOTE — Progress Notes (Signed)
 NG tube removed without any complications and it was fully intact. Pt tolerated removal very well.

## 2023-06-27 NOTE — Progress Notes (Signed)
 [facility]  - [place] [facility]   PATIENT NAME: Francisco Porter    MR#:  [id number]  DATE OF BIRTH:  [birth year]  SUBJECTIVE:  NG tube removed. Family at bedside. Patient reports to bowel movements today. Tolerating clear liquid. Abdominal cramping improved.    VITALS:  Blood pressure 128/68, pulse 83, temperature 98.2 F (36.8 C), temperature source Oral, resp. rate 18, height 5\' 7"  (1.702 m), weight 86.2 kg, SpO2 93%.  PHYSICAL EXAMINATION:   GENERAL:  58 y.o.-year-old patient with no acute distress.  LUNGS: Normal breath sounds bilaterally, no wheezing CARDIOVASCULAR: S1, S2 normal. No murmur   ABDOMEN: Soft, nontender, nondistended. Bowel sounds present.  EXTREMITIES: No  edema b/l.    NEUROLOGIC: nonfocal  patient is alert and awake SKIN: No obvious rash, lesion, or ulcer.   LABORATORY PANEL:  CBC Recent Labs  Lab 06/27/23 0425  WBC 6.4  HGB 15.7  HCT 43.9  PLT 147*    Chemistries  Recent Labs  Lab 06/26/23 1409 06/27/23 0425  NA 137 140  K 4.1 3.9  CL 104 109  CO2 18* 21*  GLUCOSE 166* 89  BUN 17 19  CREATININE 0.84 0.76  CALCIUM  10.7* 8.5*  AST 34  --   ALT 58*  --   ALKPHOS 51  --   BILITOT 1.3*  --    Cardiac Enzymes No results for input(s): "TROPONINI" in the last 168 hours. RADIOLOGY:  DG Abd Portable 1V-Small Bowel Obstruction Protocol-initial, 8 hr delay Result Date: 06/27/2023 CLINICAL DATA:  Small bowel obstruction. EXAM: PORTABLE ABDOMEN - 1 VIEW COMPARISON:  06/26/2023 FINDINGS: NG tube tip is in the stomach. No gaseous small bowel dilatation on the current study. Contrast material is seen throughout the nondilated colon from the cecum to the rectum. IMPRESSION: No gaseous small bowel dilatation on the current study. Contrast material is seen throughout the nondilated colon. Electronically Signed   By: Donnal Fusi M.D.   On: 06/27/2023 05:06   DG Abd Portable 1V-Small Bowel Protocol-Position Verification Result Date:  06/26/2023 CLINICAL DATA:  Nasogastric tube placement EXAM: PORTABLE ABDOMEN - 1 VIEW COMPARISON:  CT earlier today FINDINGS: Tip and side port of the enteric tube below the diaphragm in the stomach. Gaseous small bowel distension is partially included in the field of view. IMPRESSION: Tip and side port of the enteric tube below the diaphragm in the stomach. Electronically Signed   By: Chadwick Colonel M.D.   On: 06/26/2023 19:18   CT ABDOMEN PELVIS W CONTRAST Result Date: 06/26/2023 CLINICAL DATA:  Abdominal pain. History of recurrent small bowel obstruction. EXAM: CT ABDOMEN AND PELVIS WITH CONTRAST TECHNIQUE: Multidetector CT imaging of the abdomen and pelvis was performed using the standard protocol following bolus administration of intravenous contrast. RADIATION DOSE REDUCTION: This exam was performed according to the departmental dose-optimization program which includes automated exposure control, adjustment of the mA and/or kV according to patient size and/or use of iterative reconstruction technique. CONTRAST:  OMNIPAQUE  IOHEXOL  300 MG/ML  SOLN COMPARISON:  CT abdomen pelvis dated 03/06/2022. FINDINGS: Lower chest: The visualized lung bases are clear. No intra-abdominal free air or free fluid. Hepatobiliary: The liver is unremarkable. No biliary dilatation. Small gallstones. No pericholecystic fluid or evidence of acute cholecystitis by CT. Pancreas: Unremarkable. No pancreatic ductal dilatation or surrounding inflammatory changes. Spleen: Normal in size without focal abnormality. Adrenals/Urinary Tract: The adrenal glands unremarkable. The kidneys, visualized ureters, and urinary bladder appear unremarkable. Stomach/Bowel: Multiple dilated loops of small bowel  measure up to 4 cm in caliber. A transition is noted in the anterior right lower abdomen (62/2 and coronal 25/5) likely related to peritoneal adhesions. There is diffuse colonic diverticulosis. Postsurgical changes of the sigmoid colon with  anastomotic staple line. The appendix is normal. Vascular/Lymphatic: Mild aortoiliac atherosclerotic disease. The IVC is unremarkable. No portal venous gas. There is no adenopathy. Reproductive: The prostate and seminal vesicles are grossly remarkable. No pelvic mass. Other: Ventral hernia repair mesh.  No recurrent hernia. Musculoskeletal: No acute or significant osseous findings. IMPRESSION: 1. Small bowel obstruction with a transition in the anterior right lower abdomen likely related to peritoneal adhesions. 2. Colonic diverticulosis. Normal appendix. 3. Cholelithiasis. 4.  Aortic Atherosclerosis (ICD10-I70.0). Electronically Signed   By: Angus Bark M.D.   On: 06/26/2023 17:38   Assessment and Plan  Francisco Porter is a 58 year old male with history of diverticulosis status post sigmoidectomy, hypertension, cad, non-insulin -dependent diabetes mellitus, hyperlipidemia, who presents emergency department for chief concerns of abdominal pain.   Small bowel obstruction (HCC) --Suspect secondary to peritoneal adhesion per CT read in setting of prior history of sigmoidectomy --Symptomatic support --Received IVF-- diet being advanced by general surgery.(Dr Rosea Conch) -- patient has had two bowel movements today overall feeling better.   Diabetes mellitus without complication (HCC) --Insulin  SSI with at bedtime coverage ordered   Metabolic acidosis --Mild suspect secondary to vomiting -- improving.  CAD (coronary artery disease) Home aspirin and rosuvastatin  are not resumed on admission    Procedures: Family communication : daughters Consults : general surgery CODE STATUS: full DVT Prophylaxis : heparin  Level of care: Telemetry Medical Status is: Inpatient Remains inpatient appropriate because: SBO    TOTAL TIME TAKING CARE OF THIS PATIENT: 35 minutes.  >50% time spent on counselling and coordination of care  Note: This dictation was prepared with Dragon dictation along with smaller phrase  technology. Any transcriptional errors that result from this process are unintentional.  Melvinia Stager M.D    [facility]   CC: Primary care physician; Lyle San, MD

## 2023-06-27 NOTE — Plan of Care (Signed)
   Problem: Education: Goal: Ability to describe self-care measures that may prevent or decrease complications (Diabetes Survival Skills Education) will improve Outcome: Progressing Goal: Individualized Educational Video(s) Outcome: Progressing   Problem: Coping: Goal: Ability to adjust to condition or change in health will improve Outcome: Progressing   Problem: Fluid Volume: Goal: Ability to maintain a balanced intake and output will improve Outcome: Progressing   Problem: Health Behavior/Discharge Planning: Goal: Ability to identify and utilize available resources and services will improve Outcome: Progressing Goal: Ability to manage health-related needs will improve Outcome: Progressing   Problem: Metabolic: Goal: Ability to maintain appropriate glucose levels will improve Outcome: Progressing   Problem: Nutritional: Goal: Maintenance of adequate nutrition will improve Outcome: Progressing Goal: Progress toward achieving an optimal weight will improve Outcome: Progressing   Problem: Skin Integrity: Goal: Risk for impaired skin integrity will decrease Outcome: Progressing   Problem: Tissue Perfusion: Goal: Adequacy of tissue perfusion will improve Outcome: Progressing   Problem: Education: Goal: Knowledge of General Education information will improve Description: Including pain rating scale, medication(s)/side effects and non-pharmacologic comfort measures Outcome: Progressing   Problem: Health Behavior/Discharge Planning: Goal: Ability to manage health-related needs will improve Outcome: Progressing   Problem: Clinical Measurements: Goal: Ability to maintain clinical measurements within normal limits will improve Outcome: Progressing Goal: Will remain free from infection Outcome: Progressing Goal: Diagnostic test results will improve Outcome: Progressing Goal: Respiratory complications will improve Outcome: Progressing Goal: Cardiovascular complication will  be avoided Outcome: Progressing   Problem: Activity: Goal: Risk for activity intolerance will decrease Outcome: Progressing   Problem: Coping: Goal: Level of anxiety will decrease Outcome: Progressing   Problem: Elimination: Goal: Will not experience complications related to bowel motility Outcome: Progressing Goal: Will not experience complications related to urinary retention Outcome: Progressing   Problem: Pain Managment: Goal: General experience of comfort will improve and/or be controlled Outcome: Progressing   Problem: Safety: Goal: Ability to remain free from injury will improve Outcome: Progressing   Problem: Skin Integrity: Goal: Risk for impaired skin integrity will decrease Outcome: Progressing

## 2023-06-27 NOTE — Progress Notes (Signed)
 [facility]- General Surgery  SURGICAL PROGRESS NOTE  Hospital Day(s): 1.   Interval History:  Patient seen and examined No acute events or new complaints overnight.  Patient reports having two bowel movements since he was admitted. He has been experiencing flatulence. Feels less distended. Reports overall feeling better.    Vital signs in last 24 hours: [min-max] current  Temp:  [97.9 F (36.6 C)-98.3 F (36.8 C)] 98.2 F (36.8 C) (05/30 0752) Pulse Rate:  [83-100] 83 (05/30 0752) Resp:  [14-20] 18 (05/30 0752) BP: (118-131)/(68-101) 128/68 (05/30 0752) SpO2:  [93 %-97 %] 93 % (05/30 0752) Weight:  [86.2 kg] 86.2 kg (05/29 1521)     Height: 5\' 7"  (170.2 cm) Weight: 86.2 kg BMI (Calculated): 29.75   Intake/Output last 2 shifts:  05/29 0701 - 05/30 0700 In: 90 [NG/GT:90] Out: 100 [Emesis/NG output:100]   Physical Exam:  Constitutional: alert, cooperative and no distress  Respiratory: breathing non-labored at rest  Cardiovascular: regular rate and sinus rhythm  Gastrointestinal: soft, non-tender, and non-distended   Labs:     Latest Ref Rng & Units 06/27/2023    4:25 AM 06/26/2023    2:09 PM 03/07/2022    5:31 AM  CBC  WBC 4.0 - 10.5 K/uL 6.4  11.4  8.1   Hemoglobin 13.0 - 17.0 g/dL 21.3  08.6  57.8   Hematocrit 39.0 - 52.0 % 43.9  53.4  47.2   Platelets 150 - 400 K/uL 147  225  227       Latest Ref Rng & Units 06/27/2023    4:25 AM 06/26/2023    2:09 PM 03/08/2022    2:35 AM  CMP  Glucose 70 - 99 mg/dL 89  469  98   BUN 6 - 20 mg/dL 19  17  13    Creatinine 0.61 - 1.24 mg/dL 6.29  5.28  4.13   Sodium 135 - 145 mmol/L 140  137  136   Potassium 3.5 - 5.1 mmol/L 3.9  4.1  3.5   Chloride 98 - 111 mmol/L 109  104  108   CO2 22 - 32 mmol/L 21  18  20    Calcium  8.9 - 10.3 mg/dL 8.5  24.4  8.0   Total Protein 6.5 - 8.1 g/dL  9.3    Total Bilirubin 0.0 - 1.2 mg/dL  1.3    Alkaline Phos 38 - 126 U/L  51    AST 15 - 41 U/L  34    ALT 0 - 44 U/L  58       Imaging  studies:   EXAM:06/27/23 PORTABLE ABDOMEN - 1 VIEW   COMPARISON:  06/26/2023   FINDINGS: NG tube tip is in the stomach. No gaseous small bowel dilatation on the current study. Contrast material is seen throughout the nondilated colon from the cecum to the rectum.   IMPRESSION: No gaseous small bowel dilatation on the current study. Contrast material is seen throughout the nondilated colon.   Assessment/Plan:  58 y.o. male with small bowel obstruction, complicated by pertinent comorbidities including type 2 diabetes mellitus, diverticulitis, hyperlipidemia, and hypertension.   - The improvement in symptoms and contrast present throughout colon on abdominal x-ray are reassuring signs that SBO is resolving - Discontinue NG tube    - Start clear liquid diet   - Continue pain management    - Continue DVT prophylaxis    -- Bates Collington Barrientos PA-C

## 2023-06-27 NOTE — TOC CM/SW Note (Signed)
 Transition of Care [facility]) - Inpatient Brief Assessment   Patient Details  Name: Francisco Porter MRN: [id number] Date of Birth: 05-22-1965  Transition of Care [facility]) CM/SW Contact:    Odilia Bennett, LCSW Phone Number: 06/27/2023, 3:36 PM   Clinical Narrative: CSW reviewed chart. No TOC needs identified at this time. CSW will continue to follow progress. Please place [facility] consult if any needs arise.  Transition of Care Asessment: Insurance and Status: Insurance coverage has been reviewed Patient has primary care physician: Yes Home environment has been reviewed: Single family home Prior level of function:: Not documented Prior/Current Home Services: No current home services Social Drivers of Health Review: SDOH reviewed no interventions necessary Readmission risk has been reviewed: Yes Transition of care needs: no transition of care needs at this time

## 2023-06-28 DIAGNOSIS — K56609 Unspecified intestinal obstruction, unspecified as to partial versus complete obstruction: Secondary | ICD-10-CM | POA: Diagnosis not present

## 2023-06-28 LAB — GLUCOSE, CAPILLARY: Glucose-Capillary: 121 mg/dL — ABNORMAL HIGH (ref 70–99)

## 2023-06-28 NOTE — Plan of Care (Signed)

## 2023-06-28 NOTE — Progress Notes (Signed)
 Discharge instructions were reviewed with patient. Questions were encouraged and answered. IV was removed. Belongings collected by patient.

## 2023-06-28 NOTE — Progress Notes (Signed)
 06/28/2023  Subjective: No acute events overnight.  Patient has been tolerating a full liquid diet without issues.  He is having bowel function and denies any abdominal pain.  Vital signs: Temp:  [98.3 F (36.8 C)-98.7 F (37.1 C)] 98.3 F (36.8 C) (05/31 0808) Pulse Rate:  [79-93] 82 (05/31 0808) Resp:  [16] 16 (05/31 0808) BP: (119-130)/(76-82) 130/78 (05/31 0808) SpO2:  [95 %-98 %] 95 % (05/31 0808)   Intake/Output: 05/30 0701 - 05/31 0700 In: 970 [P.O.:970] Out: -  Last BM Date : 06/27/23  Physical Exam: Constitutional: No acute distress Abdomen: Soft, nondistended, nontender to palpation  Labs:  Recent Labs    06/26/23 1409 06/27/23 0425  WBC 11.4* 6.4  HGB 19.0* 15.7  HCT 53.4* 43.9  PLT 225 147*   Recent Labs    06/26/23 1409 06/27/23 0425  NA 137 140  K 4.1 3.9  CL 104 109  CO2 18* 21*  GLUCOSE 166* 89  BUN 17 19  CREATININE 0.84 0.76  CALCIUM  10.7* 8.5*   No results for input(s): "LABPROT", "INR" in the last 72 hours.  Imaging: No results found.  Assessment/Plan: This is a 58 y.o. male with resolving small bowel obstruction.  - Patient is doing very well with good bowel function and able to tolerate a full liquid diet. - Will advance to soft diet this morning.  If doing well, should be okay to discharge home today. - Follow-up as needed.   I spent 35 minutes dedicated to the care of this patient on the date of this encounter to include pre-visit review of records, face-to-face time with the patient discussing diagnosis and management, and any post-visit coordination of care.  Marene Shape, MD [facility]

## 2023-06-28 NOTE — Plan of Care (Signed)
 Problem: Education: Goal: Ability to describe self-care measures that may prevent or decrease complications (Diabetes Survival Skills Education) will improve 06/28/2023 1135 by Rilla Buckman, RN Outcome: Adequate for Discharge 06/28/2023 1011 by Lamar Pillar, RN Outcome: Progressing Goal: Individualized Educational Video(s) 06/28/2023 1135 by Ranen Doolin, RN Outcome: Adequate for Discharge 06/28/2023 1011 by Lamar Pillar, RN Outcome: Progressing   Problem: Coping: Goal: Ability to adjust to condition or change in health will improve 06/28/2023 1135 by Lamar Pillar, RN Outcome: Adequate for Discharge 06/28/2023 1011 by Lamar Pillar, RN Outcome: Progressing   Problem: Fluid Volume: Goal: Ability to maintain a balanced intake and output will improve 06/28/2023 1135 by Lamar Pillar, RN Outcome: Adequate for Discharge 06/28/2023 1011 by Lamar Pillar, RN Outcome: Progressing   Problem: Health Behavior/Discharge Planning: Goal: Ability to identify and utilize available resources and services will improve 06/28/2023 1135 by Lamar Pillar, RN Outcome: Adequate for Discharge 06/28/2023 1011 by Lamar Pillar, RN Outcome: Progressing Goal: Ability to manage health-related needs will improve 06/28/2023 1135 by Lamar Pillar, RN Outcome: Adequate for Discharge 06/28/2023 1011 by Lamar Pillar, RN Outcome: Progressing   Problem: Metabolic: Goal: Ability to maintain appropriate glucose levels will improve 06/28/2023 1135 by Lamar Pillar, RN Outcome: Adequate for Discharge 06/28/2023 1011 by Lamar Pillar, RN Outcome: Progressing   Problem: Nutritional: Goal: Maintenance of adequate nutrition will improve 06/28/2023 1135 by Lamar Pillar, RN Outcome: Adequate for Discharge 06/28/2023 1011 by Lamar Pillar, RN Outcome: Progressing Goal: Progress toward achieving an optimal weight will improve 06/28/2023 1135 by Lamar Pillar, RN Outcome: Adequate for Discharge 06/28/2023 1011 by Lamar Pillar, RN Outcome: Progressing   Problem: Skin Integrity: Goal: Risk for impaired skin integrity will decrease 06/28/2023 1135 by Lamar Pillar, RN Outcome: Adequate for Discharge 06/28/2023 1011 by Lamar Pillar, RN Outcome: Progressing   Problem: Tissue Perfusion: Goal: Adequacy of tissue perfusion will improve 06/28/2023 1135 by Lamar Pillar, RN Outcome: Adequate for Discharge 06/28/2023 1011 by Lamar Pillar, RN Outcome: Progressing   Problem: Education: Goal: Knowledge of General Education information will improve Description: Including pain rating scale, medication(s)/side effects and non-pharmacologic comfort measures 06/28/2023 1135 by Lamar Pillar, RN Outcome: Adequate for Discharge 06/28/2023 1011 by Lamar Pillar, RN Outcome: Progressing   Problem: Health Behavior/Discharge Planning: Goal: Ability to manage health-related needs will improve 06/28/2023 1135 by Lamar Pillar, RN Outcome: Adequate for Discharge 06/28/2023 1011 by Lamar Pillar, RN Outcome: Progressing   Problem: Clinical Measurements: Goal: Ability to maintain clinical measurements within normal limits will improve 06/28/2023 1135 by Lamar Pillar, RN Outcome: Adequate for Discharge 06/28/2023 1011 by Lamar Pillar, RN Outcome: Progressing Goal: Will remain free from infection 06/28/2023 1135 by Lamar Pillar, RN Outcome: Adequate for Discharge 06/28/2023 1011 by Lamar Pillar, RN Outcome: Progressing Goal: Diagnostic test results will improve 06/28/2023 1135 by Lamar Pillar, RN Outcome: Adequate for Discharge 06/28/2023 1011 by Lamar Pillar, RN Outcome: Progressing Goal: Respiratory complications will improve 06/28/2023 1135 by Lamar Pillar, RN Outcome: Adequate for Discharge 06/28/2023 1011 by Lamar Pillar, RN Outcome: Progressing Goal: Cardiovascular  complication will be avoided 06/28/2023 1135 by Lamar Pillar, RN Outcome: Adequate for Discharge 06/28/2023 1011 by Lamar Pillar, RN Outcome: Progressing   Problem: Activity: Goal: Risk for activity intolerance will decrease 06/28/2023 1135 by Rosalin Buster, RN Outcome: Adequate for Discharge 06/28/2023 1011 by Lamar Pillar, RN Outcome: Progressing   Problem: Nutrition: Goal: Adequate nutrition will be maintained 06/28/2023 1135 by Karion Cudd, RN Outcome: Adequate for Discharge 06/28/2023 1011 by Lamar Pillar, RN Outcome: Progressing  Problem: Coping: Goal: Level of anxiety will decrease 06/28/2023 1135 by Elaf Clauson, RN Outcome: Adequate for Discharge 06/28/2023 1011 by Lamar Pillar, RN Outcome: Progressing   Problem: Elimination: Goal: Will not experience complications related to bowel motility 06/28/2023 1135 by Lamar Pillar, RN Outcome: Adequate for Discharge 06/28/2023 1011 by Lamar Pillar, RN Outcome: Progressing Goal: Will not experience complications related to urinary retention 06/28/2023 1135 by Lamar Pillar, RN Outcome: Adequate for Discharge 06/28/2023 1011 by Lamar Pillar, RN Outcome: Progressing   Problem: Pain Managment: Goal: General experience of comfort will improve and/or be controlled 06/28/2023 1135 by Lamar Pillar, RN Outcome: Adequate for Discharge 06/28/2023 1011 by Lamar Pillar, RN Outcome: Progressing   Problem: Safety: Goal: Ability to remain free from injury will improve 06/28/2023 1135 by Lamar Pillar, RN Outcome: Adequate for Discharge 06/28/2023 1011 by Lamar Pillar, RN Outcome: Progressing   Problem: Skin Integrity: Goal: Risk for impaired skin integrity will decrease 06/28/2023 1135 by Lamar Pillar, RN Outcome: Adequate for Discharge 06/28/2023 1011 by Lamar Pillar, RN Outcome: Progressing

## 2023-06-28 NOTE — Discharge Instructions (Signed)
 Discharge Instructions: Please continue a soft diet for the next 48-72 hours to allow your intestines to settle down better.  After that, can resume your regular home diet. If any nausea, vomiting, worsening pain, or no flatus/BM for more than 24 hrs, please do not hesitate to reach out to Dr. Edson Graces office or come to the ER for further evaluation.

## 2023-06-28 NOTE — Discharge Summary (Signed)
 Physician Discharge Summary   Patient: Francisco Porter MRN: [id number] DOB: November 28, 1965  Admit date:     06/26/2023  Discharge date: 06/28/23  Discharge Physician: Melvinia Stager   PCP: Lyle San, MD   Recommendations at discharge:    F/u Dr Rosea Conch in 2 weeks Return to ER if symptoms recur F/u PCP in 1-2 weeks  Discharge Diagnoses: Principal Problem:   Small bowel obstruction [facility]) Active Problems:   Essential hypertension   CAD (coronary artery disease)   Metabolic acidosis   Diabetes mellitus without complication (HCC)   Hyperlipidemia associated with type 2 diabetes mellitus (HCC)  Francisco Porter is a 58 year old male with history of diverticulosis status post sigmoidectomy, hypertension, cad, non-insulin -dependent diabetes mellitus, hyperlipidemia, who presents emergency department for chief concerns of abdominal pain.    Small bowel obstruction (HCC) H/o Diverticulitis s/p Sigmoid Colectomy --Suspect secondary to peritoneal adhesion per CT read in setting of prior history of sigmoidectomy --Symptomatic support --Received IVF-- diet being advanced by general surgery.(Dr Rosea Conch) -- patient has had two bowel movements today overall feeling better. --5/31--feels better. Tolerated soft diet. Ok with Dr Mauri Sous for discharge. Pt agreeable   Diabetes mellitus without complication (HCC) --Insulin  SSI with at bedtime coverage ordered --resume home meds   Metabolic acidosis --Mild suspect secondary to vomiting -- improving.   CAD (coronary artery disease) --Home aspirin and rosuvastatin  are not resumed on admission     Procedures:none Family communication :none today Consults : general surgery--dr Piscoya CODE STATUS: full DVT Prophylaxis : heparin       Disposition: Home Diet recommendation:  Discharge Diet Orders (From admission, onward)     Start     Ordered   06/28/23 0000  Diet - low sodium heart healthy        06/28/23 1012           Cardiac diet DISCHARGE  MEDICATION: Allergies as of 06/28/2023       Reactions   Atorvastatin Other (See Comments)   Myalgia   Codeine Hives, Rash        Medication List     STOP taking these medications    phentermine 37.5 MG tablet Commonly known as: ADIPEX-P   psyllium 58.6 % packet Commonly known as: METAMUCIL   Semaglutide(0.25 or 0.5MG /DOS) 2 MG/3ML Sopn       TAKE these medications    aspirin EC 81 MG tablet Take 81 mg by mouth Francisco. Swallow whole.   Fish Oil 1000 MG Caps Take 2 capsules by mouth Francisco at 8 pm.   losartan  25 MG tablet Commonly known as: COZAAR  Take 25 mg by mouth Francisco.   montelukast  10 MG tablet Commonly known as: SINGULAIR  Take 10 mg by mouth Francisco.   omeprazole 20 MG capsule Commonly known as: PRILOSEC Take 20 mg by mouth Francisco.   rosuvastatin  40 MG tablet Commonly known as: CRESTOR  TAKE 1 TABLET BY MOUTH EVERY DAY   Synjardy XR 12.05-998 MG Tb24 Generic drug: Empagliflozin-metFORMIN HCl ER Take 2 tablets by mouth Francisco.   tadalafil 20 MG tablet Commonly known as: CIALIS Take 20 mg by mouth Francisco as needed for erectile dysfunction.        Follow-up Information     [place], Isami, DO Follow up.   Specialties: General Surgery, Surgery Why: As needed Contact information: [address] [place] [address] [phone]         Lyle San, MD. Schedule an appointment as soon as possible for a visit in 1 week(s).  Specialty: Family Medicine Contact information: [address] [place] [address] [phone]                 Cleavon Curls Weights   06/26/23 1521  Weight: 86.2 kg     Condition at discharge: fair  The results of significant diagnostics from this hospitalization (including imaging, microbiology, ancillary and laboratory) are listed below for reference.   Imaging Studies: DG Abd Portable 1V-Small Bowel Obstruction Protocol-initial, 8 hr delay Result Date: 06/27/2023 CLINICAL DATA:  Small bowel obstruction.  EXAM: PORTABLE ABDOMEN - 1 VIEW COMPARISON:  06/26/2023 FINDINGS: NG tube tip is in the stomach. No gaseous small bowel dilatation on the current study. Contrast material is seen throughout the nondilated colon from the cecum to the rectum. IMPRESSION: No gaseous small bowel dilatation on the current study. Contrast material is seen throughout the nondilated colon. Electronically Signed   By: Donnal Fusi M.D.   On: 06/27/2023 05:06   DG Abd Portable 1V-Small Bowel Protocol-Position Verification Result Date: 06/26/2023 CLINICAL DATA:  Nasogastric tube placement EXAM: PORTABLE ABDOMEN - 1 VIEW COMPARISON:  CT earlier today FINDINGS: Tip and side port of the enteric tube below the diaphragm in the stomach. Gaseous small bowel distension is partially included in the field of view. IMPRESSION: Tip and side port of the enteric tube below the diaphragm in the stomach. Electronically Signed   By: Chadwick Colonel M.D.   On: 06/26/2023 19:18   CT ABDOMEN PELVIS W CONTRAST Result Date: 06/26/2023 CLINICAL DATA:  Abdominal pain. History of recurrent small bowel obstruction. EXAM: CT ABDOMEN AND PELVIS WITH CONTRAST TECHNIQUE: Multidetector CT imaging of the abdomen and pelvis was performed using the standard protocol following bolus administration of intravenous contrast. RADIATION DOSE REDUCTION: This exam was performed according to the departmental dose-optimization program which includes automated exposure control, adjustment of the mA and/or kV according to patient size and/or use of iterative reconstruction technique. CONTRAST:  OMNIPAQUE  IOHEXOL  300 MG/ML  SOLN COMPARISON:  CT abdomen pelvis dated 03/06/2022. FINDINGS: Lower chest: The visualized lung bases are clear. No intra-abdominal free air or free fluid. Hepatobiliary: The liver is unremarkable. No biliary dilatation. Small gallstones. No pericholecystic fluid or evidence of acute cholecystitis by CT. Pancreas: Unremarkable. No pancreatic ductal  dilatation or surrounding inflammatory changes. Spleen: Normal in size without focal abnormality. Adrenals/Urinary Tract: The adrenal glands unremarkable. The kidneys, visualized ureters, and urinary bladder appear unremarkable. Stomach/Bowel: Multiple dilated loops of small bowel measure up to 4 cm in caliber. A transition is noted in the anterior right lower abdomen (62/2 and coronal 25/5) likely related to peritoneal adhesions. There is diffuse colonic diverticulosis. Postsurgical changes of the sigmoid colon with anastomotic staple line. The appendix is normal. Vascular/Lymphatic: Mild aortoiliac atherosclerotic disease. The IVC is unremarkable. No portal venous gas. There is no adenopathy. Reproductive: The prostate and seminal vesicles are grossly remarkable. No pelvic mass. Other: Ventral hernia repair mesh.  No recurrent hernia. Musculoskeletal: No acute or significant osseous findings. IMPRESSION: 1. Small bowel obstruction with a transition in the anterior right lower abdomen likely related to peritoneal adhesions. 2. Colonic diverticulosis. Normal appendix. 3. Cholelithiasis. 4.  Aortic Atherosclerosis (ICD10-I70.0). Electronically Signed   By: Angus Bark M.D.   On: 06/26/2023 17:38    Microbiology: No results found for this or any previous visit.  Labs: CBC: Recent Labs  Lab 06/26/23 1409 06/27/23 0425  WBC 11.4* 6.4  HGB 19.0* 15.7  HCT 53.4* 43.9  MCV 86.1 87.6  PLT  225 147*   Basic Metabolic Panel: Recent Labs  Lab 06/26/23 1409 06/27/23 0425  NA 137 140  K 4.1 3.9  CL 104 109  CO2 18* 21*  GLUCOSE 166* 89  BUN 17 19  CREATININE 0.84 0.76  CALCIUM  10.7* 8.5*   Liver Function Tests: Recent Labs  Lab 06/26/23 1409  AST 34  ALT 58*  ALKPHOS 51  BILITOT 1.3*  PROT 9.3*  ALBUMIN 5.6*   CBG: Recent Labs  Lab 06/27/23 0749 06/27/23 1157 06/27/23 1628 06/27/23 2101 06/28/23 0807  GLUCAP 83 181* 181* 144* 121*    Discharge time spent: greater than 30  minutes.  Signed: Melvinia Stager, MD [facility] 06/28/2023

## 2023-07-10 ENCOUNTER — Other Ambulatory Visit: Payer: Self-pay | Admitting: Internal Medicine

## 2023-08-27 ENCOUNTER — Encounter: Payer: Self-pay | Admitting: Medical

## 2023-08-27 ENCOUNTER — Ambulatory Visit: Attending: Medical | Admitting: Medical

## 2023-08-27 VITALS — BP 131/84 | HR 102 | Resp 18 | Ht 67.0 in | Wt 190.0 lb

## 2023-08-27 DIAGNOSIS — I10 Essential (primary) hypertension: Secondary | ICD-10-CM | POA: Diagnosis not present

## 2023-08-27 DIAGNOSIS — I38 Endocarditis, valve unspecified: Secondary | ICD-10-CM

## 2023-08-27 DIAGNOSIS — I2510 Atherosclerotic heart disease of native coronary artery without angina pectoris: Secondary | ICD-10-CM | POA: Diagnosis not present

## 2023-08-27 DIAGNOSIS — I452 Bifascicular block: Secondary | ICD-10-CM

## 2023-08-27 DIAGNOSIS — E782 Mixed hyperlipidemia: Secondary | ICD-10-CM | POA: Diagnosis not present

## 2023-08-27 DIAGNOSIS — Z79899 Other long term (current) drug therapy: Secondary | ICD-10-CM

## 2023-08-27 NOTE — Patient Instructions (Signed)
 Medication Instructions:  Your physician recommends that you continue on your current medications as directed. Please refer to the Current Medication list given to you today.    *If you need a refill on your cardiac medications before your next appointment, please call your pharmacy*  Lab Work: No labs ordered today    Testing/Procedures: Your physician has requested that you have an echocardiogram in 1 year. Echocardiography is a painless test that uses sound waves to create images of your heart. It provides your doctor with information about the size and shape of your heart and how well your heart's chambers and valves are working.   You may receive an ultrasound enhancing agent through an IV if needed to better visualize your heart during the echo. This procedure takes approximately one hour.  There are no restrictions for this procedure.  This will take place at [address] [facility] [address]) [address], [place] [address]  Please note: We ask at that you not bring children with you during ultrasound (echo/ vascular) testing. Due to room size and safety concerns, children are not allowed in the ultrasound rooms during exams. Our front office staff cannot provide observation of children in our lobby area while testing is being conducted. An adult accompanying a patient to their appointment will only be allowed in the ultrasound room at the discretion of the ultrasound technician under special circumstances. We apologize for any inconvenience.   Follow-Up: At [facility], you and your health needs are our priority.  As part of our continuing mission to provide you with exceptional heart care, our providers are all part of one team.  This team includes your primary Cardiologist (physician) and Advanced Practice Providers or APPs (Physician Assistants and Nurse Practitioners) who all work together to provide you with the care you need, when you need it.  Your next appointment:    1 year(s)  Provider:   Lonni Hanson, MD or Cadence Franchester, PA-C

## 2023-08-27 NOTE — Progress Notes (Signed)
 Cardiology Office Note   Date:  08/27/2023  ID:  Archer, Moist 08/08/1965, MRN [id number] PCP: Valora Agent, MD   [facility] Prov[facility] Cardiologist:  Lonni Hanson, MD    History of Present Illness Francisco Porter is a 58 y.o. male with a hx of CAD, bifasicular block, HTN, HLD, and DM2 who presents for 1 year follow-up.    In August 2022 the patient was noted to have an abnormal EKG. Echo showed mild LVH and aortic valve sclerosis. Coronary CTA showed mild plaquing of the LAD and Lcx with a coronary calcium  score of 184.    The patient was last seen 07/2022 and was stable from a cardiac perspective. He reported weight loss of 25lbs from Ozempic.  Today, the patient is overall OK from a cardiac perspective. He continues to have stomach issues requiring hospitalizations. He denies chest pain, SOB, palpitations, heart racing, lower leg edema. BP is normally lower at home.   Studies Reviewed      Cardiac CTA 09/2020   IMPRESSION: 1. Coronary calcium  score of 184. This was 87th percentile for age and sex matched control.   2. Normal coronary origin with left dominance.   3. Minimal stenosis (<25%), involving the LAD and LCx arteries.   4. CAD-RADS 1. Minimal non-obstructive CAD (0-24%). Consider non-atherosclerotic causes of chest pain. Consider preventive therapy and risk factor modification.  Echo 09/2020 1. Left ventricular ejection fraction, by estimation, is 60 to 65%. The  left ventricle has normal function. The left ventricle has no regional  wall motion abnormalities. There is mild left ventricular hypertrophy.  Left ventricular diastolic parameters  were normal.   2. Right ventricular systolic function is normal. The right ventricular  size is normal. Tricuspid regurgitation signal is inadequate for assessing  PA pressure.   3. The mitral valve is normal in structure. No evidence of mitral valve  regurgitation. No evidence of mitral stenosis.   4. The  aortic valve was not well visualized but valve opening and  calcifications are highly suggestive of bicuspid valve. Aortic valve  regurgitation is not visualized. Mild to moderate aortic valve  sclerosis/calcification is present, without any  evidence of aortic stenosis. Aortic valve area, by VTI measures 2.44 cm.  Aortic valve mean gradient measures 10.0 mmHg.   5. Aortic dilatation noted. There is borderline dilatation of the aortic  root, measuring 38 mm.   6. The inferior vena cava is normal in size with greater than 50%  respiratory variability, suggesting right atrial pressure of 3 mmHg.       Physical Exam VS:  BP 131/84 (BP Location: Left Arm, Patient Position: Sitting, Cuff Size: Normal)   Pulse (!) 102   Resp 18   Ht 5' 7 (1.702 m)   Wt 190 lb (86.2 kg)   SpO2 94%   BMI 29.76 kg/m        Wt Readings from Last 3 Encounters:  08/27/23 190 lb (86.2 kg)  06/26/23 190 lb (86.2 kg)  08/07/22 180 lb 6.4 oz (81.8 kg)    GEN: Well nourished, well developed in no acute distress NECK: No JVD; No carotid bruits CARDIAC: RRR, + murmurs, rubs, gallops RESPIRATORY:  Clear to auscultation without rales, wheezing or rhonchi  ABDOMEN: Soft, non-tender, non-distended EXTREMITIES:  No edema; No deformity   ASSESSMENT AND PLAN  Nonobstructive CAD Patient denies anginal symptoms.  Cardiac CTA in 2022 showed nonobstructive CAD.  Continue aspirin and statin therapy.  Bifascicular block Recent EKG showed  NSR with no high grade heart block  HLD LDL 54. I will update fasting lipid panel. Continue Crestor  40mg  daily.   HTN BP is reasonable.  Continue losartan  25 mg daily.  DM2 Recent A1c 6.7.  This is managed by primary care.  Valve disease Echo in 2022 showed possibly bicuspid aortic valve with mild to mod aortic valve scelrosis. Murmur on exam today. I will update this.         Dispo: Follow-up in 1 year  Signed, Chasyn Cinque VEAR Fishman, PA-C

## 2023-09-26 ENCOUNTER — Other Ambulatory Visit: Payer: Self-pay | Admitting: Medical

## 2023-10-15 ENCOUNTER — Ambulatory Visit: Attending: Medical

## 2023-10-15 DIAGNOSIS — I38 Endocarditis, valve unspecified: Secondary | ICD-10-CM

## 2023-10-15 LAB — ECHOCARDIOGRAM COMPLETE
AR max vel: 1.39 cm2
AV Area VTI: 1.35 cm2
AV Area mean vel: 1.24 cm2
AV Mean grad: 16 mmHg
AV Peak grad: 31.1 mmHg
Ao pk vel: 2.79 m/s
Area-P 1/2: 3.17 cm2
S' Lateral: 3.04 cm

## 2023-10-21 ENCOUNTER — Ambulatory Visit: Payer: Self-pay | Admitting: Medical
